# Patient Record
Sex: Female | Born: 1960
Health system: Southern US, Community
[De-identification: ages and names within clinical notes are randomized; demographics above are authoritative.]

## PROBLEM LIST (undated history)

## (undated) DIAGNOSIS — I10 Essential (primary) hypertension: Secondary | ICD-10-CM

## (undated) DIAGNOSIS — F32A Depression, unspecified: Secondary | ICD-10-CM

## (undated) HISTORY — DX: Depression, unspecified: F32.A

## (undated) HISTORY — PX: DG THUMB LEFT HAND: HXRAD1658

## (undated) HISTORY — PX: ABDOMINAL HYSTERECTOMY: SHX81

---

## 2005-12-25 ENCOUNTER — Emergency Department (HOSPITAL_COMMUNITY): Admission: EM | Admit: 2005-12-25 | Discharge: 2005-12-25 | Payer: Self-pay | Admitting: Emergency Medicine

## 2006-09-16 ENCOUNTER — Emergency Department (HOSPITAL_COMMUNITY): Admission: EM | Admit: 2006-09-16 | Discharge: 2006-09-16 | Payer: Self-pay | Admitting: Family Medicine

## 2007-03-21 ENCOUNTER — Emergency Department (HOSPITAL_COMMUNITY): Admission: EM | Admit: 2007-03-21 | Discharge: 2007-03-21 | Payer: Self-pay | Admitting: Emergency Medicine

## 2007-08-10 ENCOUNTER — Emergency Department (HOSPITAL_COMMUNITY): Admission: EM | Admit: 2007-08-10 | Discharge: 2007-08-10 | Payer: Self-pay | Admitting: Family Medicine

## 2007-12-18 ENCOUNTER — Emergency Department (HOSPITAL_COMMUNITY): Admission: EM | Admit: 2007-12-18 | Discharge: 2007-12-18 | Payer: Self-pay | Admitting: Emergency Medicine

## 2008-06-30 ENCOUNTER — Emergency Department (HOSPITAL_COMMUNITY): Admission: EM | Admit: 2008-06-30 | Discharge: 2008-06-30 | Payer: Self-pay | Admitting: Family Medicine

## 2010-01-06 ENCOUNTER — Emergency Department (HOSPITAL_COMMUNITY): Admission: EM | Admit: 2010-01-06 | Discharge: 2010-01-06 | Payer: Self-pay | Admitting: Emergency Medicine

## 2010-08-25 ENCOUNTER — Other Ambulatory Visit: Payer: Self-pay | Admitting: Internal Medicine

## 2010-08-25 DIAGNOSIS — Z1231 Encounter for screening mammogram for malignant neoplasm of breast: Secondary | ICD-10-CM

## 2010-09-03 ENCOUNTER — Ambulatory Visit: Payer: Self-pay

## 2010-09-21 LAB — WET PREP, GENITAL: Yeast Wet Prep HPF POC: NONE SEEN

## 2010-09-21 LAB — GC/CHLAMYDIA PROBE AMP, GENITAL: Chlamydia, DNA Probe: NEGATIVE

## 2011-03-18 LAB — POCT URINALYSIS DIP (DEVICE)
Glucose, UA: NEGATIVE
Ketones, ur: NEGATIVE
Operator id: 200941
Protein, ur: 100 — AB
Specific Gravity, Urine: >=1.03
pH: 6

## 2011-03-18 LAB — URINE CULTURE

## 2011-03-18 LAB — URINALYSIS, MICROSCOPIC ONLY
Glucose, UA: NEGATIVE
Ketones, ur: NEGATIVE
Nitrite: NEGATIVE
Specific Gravity, Urine: 1.03
Urobilinogen, UA: 1

## 2013-02-07 ENCOUNTER — Encounter (HOSPITAL_COMMUNITY): Payer: Self-pay | Admitting: *Deleted

## 2013-02-07 ENCOUNTER — Emergency Department (HOSPITAL_COMMUNITY)
Admission: EM | Admit: 2013-02-07 | Discharge: 2013-02-07 | Disposition: A | Discharge status: Home / Self Care | Payer: PRIVATE HEALTH INSURANCE | Attending: Emergency Medicine | Admitting: Emergency Medicine

## 2013-02-07 DIAGNOSIS — G44209 Tension-type headache, unspecified, not intractable: Secondary | ICD-10-CM

## 2013-02-07 DIAGNOSIS — I10 Essential (primary) hypertension: Secondary | ICD-10-CM | POA: Insufficient documentation

## 2013-02-07 HISTORY — DX: Essential (primary) hypertension: I10

## 2013-02-07 MED ORDER — BUPIVACAINE HCL (PF) 0.25 % IJ SOLN: 5.0000 mL | Freq: Once | INTRAMUSCULAR | Status: DC

## 2013-02-07 MED ORDER — BUPIVACAINE HCL (PF) 0.25 % IJ SOLN
5.0000 mL | INTRAMUSCULAR | Status: AC
  Administered 2013-02-07: 5 mL
  Filled 2013-02-07: qty 30

## 2013-02-07 MED ORDER — BUPIVACAINE HCL 0.25 % IJ SOLN
5.0000 mL | INTRAMUSCULAR | Status: DC
  Filled 2013-02-07 (×2): qty 5

## 2013-02-07 MED ORDER — DIAZEPAM 5 MG PO TABS: 5.0000 mg | ORAL_TABLET | Freq: Four times a day (QID) | ORAL | Status: DC | PRN

## 2013-02-07 NOTE — ED Notes (Signed)
Pt states for the last 3 days the top of her head is pounding.  Pt states it starts in her neck and radiates up to head.  Pt reports some dizziness and feels like she has to move slow.  Pt states she has never had a headache like this before.  Pt reports nausea and denies vomiting.

## 2013-02-07 NOTE — ED Provider Notes (Signed)
CSN: [id number]     Arrival date & time 02/07/13  1545 History   First MD Initiated Contact with Patient 02/07/13 [phone]     Chief Complaint  Patient presents with  . Headache   (Consider location/radiation/quality/duration/timing/severity/associated sxs/prior Treatment) Patient is a 52 y.o. female presenting with headaches. The history is provided by the patient. No language interpreter was used.  Headache Pain location:  Generalized Quality: throbbing. Radiates to:  R neck Severity currently:  8/10 Severity at highest:  8/10 Onset quality:  Gradual Duration:  4 days Associated symptoms: neck pain   Associated symptoms: no abdominal pain, no blurred vision, no congestion, no cough, no diarrhea, no pain, no fever, no focal weakness, no hearing loss, no nausea, no neck stiffness, no numbness, no paresthesias, no sore throat, no syncope, no URI, no vomiting and no weakness   Risk factors: no family hx of SAH     Past Medical History  Diagnosis Date  . Hypertension    Past Surgical History  Procedure Laterality Date  . Abdominal hysterectomy     No family history on file. History  Substance Use Topics  . Smoking status: Never Smoker   . Smokeless tobacco: Not on file  . Alcohol Use: Yes     Comment: occ   OB History   Grav Para Term Preterm Abortions TAB SAB Ect Mult Living                 Review of Systems  Constitutional: Negative for fever.  HENT: Positive for neck pain. Negative for hearing loss, congestion, sore throat, rhinorrhea and neck stiffness.   Eyes: Negative for blurred vision and pain.  Respiratory: Negative for cough and shortness of breath.   Cardiovascular: Negative for chest pain and syncope.  Gastrointestinal: Negative for nausea, vomiting, abdominal pain and diarrhea.  Genitourinary: Negative for dysuria and hematuria.  Skin: Negative for rash.  Neurological: Negative for focal weakness, syncope, light-headedness, numbness, headaches and  paresthesias.  All other systems reviewed and are negative.    Allergies  Review of patient's allergies indicates no known allergies.  Home Medications   Current Outpatient Rx  Name  Route  Sig  Dispense  Refill  . acetaminophen (TYLENOL) 500 MG tablet   Oral   Take 1,000 mg by mouth every 4 (four) hours as needed for pain (headache).          BP 135/92  Pulse 81  Temp(Src) 98.1 F (36.7 C) (Oral)  Resp 18  SpO2 100% Physical Exam  Nursing note and vitals reviewed. Constitutional: She is oriented to person, place, and time. She appears well-developed and well-nourished.  HENT:  Head: Normocephalic and atraumatic.  Right Ear: External ear normal.  Left Ear: External ear normal.  Eyes: EOM are normal. Pupils are equal, round, and reactive to light.  Neck: Normal range of motion. Neck supple.  Cardiovascular: Normal rate, regular rhythm and intact distal pulses.  Exam reveals no gallop and no friction rub.   No murmur heard. Pulmonary/Chest: Effort normal and breath sounds normal. No respiratory distress. She has no wheezes. She has no rales. She exhibits no tenderness.  Abdominal: Soft. Bowel sounds are normal. She exhibits no distension. There is no tenderness. There is no rebound.  Musculoskeletal: Normal range of motion. She exhibits no edema and no tenderness.  Lymphadenopathy:    She has no cervical adenopathy.  Neurological: She is alert and oriented to person, place, and time. She displays normal reflexes. No cranial nerve  deficit. She exhibits normal muscle tone. Coordination normal.  Skin: Skin is warm. No rash noted.  Psychiatric: She has a normal mood and affect. Her behavior is normal.    ED Course  Procedures (including critical care time) Labs Review Labs Reviewed - No data to display Imaging Review No results found.  MDM  No diagnosis found. 4:10 PM Pt is a 52 y.o. female with pertinent PMHX of HTN, off HCTZ for 3 years who presents to the ED with  headache. Headache described as throbbing in nature from her right lateral neck to the parietal region. Similarto previous headache pt had after giving birth to sone pt described " as related to my blood pressure".Has been off antihypertensive (HCTZ unknown dose for 3 years) Pt has associated nausea, denies vomiting, denies visual disturbance. No Aura prior to symptoms. No red flags noted on history such as: No Thunderclap symptoms, pt does not endorse unilateral vision loss or hearing loss. Pt denies recent illness.Denies photophobia/phonophobia. Denies seizure like activity.  On exam AFVSS. Neurologic exam: Fundoscopic exam: no papilledema, no hemorrhages, CN I-XII: grossly intact, Sensation: normal in upper and lower extremities, Strength 5/5 in both upper and lower extremities, Coordination intact. Gait normal. Trigger point noted to right lateral neck.  No Concerning signs for Thunderclap, will hold off CTA head neck to exclude aneurysm, SAH  Plan for trigger point injection of 2cc 0.25%marcaine, and script for valium. Pt tolerated procedure without complication.Liekly tension headache. Doubt migraine, no aura no photophobia or nausea or vomiting.   5:23 PM: I have discussed the diagnosis/risks/treatment options with the patient and believe the pt to be eligible for discharge home to [facility]. We also discussed returning to the ED immediately if new or worsening sx occur. We discussed the sx which are most concerning (e.g., thunderclap, stroke like symptoms, worsening symptoms) that necessitate immediate return. Any new prescriptions provided to the patient are listed below.   New Prescriptions   DIAZEPAM (VALIUM) 5 MG TABLET    Take 1 tablet (5 mg total) by mouth every 6 (six) hours as needed (spasms).    The patient appears reasonably screened and/or stabilized for discharge and I doubt any other medical condition or other [facility] requiring further screening,  evaluation or treatment in the ED at this time prior to discharge . Pt in agreement with discharge plan. Return precautions given. Pt discharged VSS  Pt was discussed with my attending, Dr. Erlene Quan, MD 02/07/13 [phone]

## 2013-02-10 NOTE — ED Provider Notes (Signed)
I saw and evaluated the patient, reviewed the resident's note and I agree with the findings and plan. Patient with headache. Maybe musculoskeletal. Will discharge  Leslie Steele. Rubin Payor, MD 02/10/13 [phone]

## 2013-02-14 ENCOUNTER — Ambulatory Visit: Payer: PRIVATE HEALTH INSURANCE | Attending: Cardiology | Admitting: Cardiology

## 2013-02-14 ENCOUNTER — Encounter: Payer: Self-pay | Admitting: Cardiology

## 2013-02-14 VITALS — BP 194/131 | HR 77 | Temp 98.7°F | Resp 18 | Ht 62.99 in | Wt 161.0 lb

## 2013-02-14 DIAGNOSIS — I10 Essential (primary) hypertension: Secondary | ICD-10-CM | POA: Insufficient documentation

## 2013-02-14 DIAGNOSIS — M542 Cervicalgia: Secondary | ICD-10-CM | POA: Insufficient documentation

## 2013-02-14 MED ORDER — AMLODIPINE BESYLATE 10 MG PO TABS: 10.0000 mg | ORAL_TABLET | Freq: Every day | ORAL | Status: DC

## 2013-02-14 MED ORDER — AMLODIPINE BESYLATE 10 MG PO TABS: 10.0000 mg | ORAL_TABLET | Freq: Once | ORAL | Status: DC

## 2013-02-14 MED ORDER — AMLODIPINE BESYLATE 2.5 MG PO TABS
10.0000 mg | ORAL_TABLET | Freq: Once | ORAL | Status: AC
  Administered 2013-02-14: 10 mg via ORAL

## 2013-02-14 NOTE — Progress Notes (Signed)
Pt is here to est care Recently seen at [facility] ER C/o neck pain that radiates to head BP today is 194/131... Denies: SOB, CP, edema, blurry vision Reports no hx of HTN Dr. Daleen Squibb has been notified. Pt is alert w/no signs of acute distress.

## 2013-02-14 NOTE — Progress Notes (Signed)
HPI Ms Leslie Steele comes in today after being seen in emergency room for neck pain and hypertension. He complains of constant aching in the right base of her skull. It is not made worse by turning. At night it goes up into her head and usually  wakes her up at 5 AM.  She has a history of hypertension but quit taking her hydrochlorothiazide because she did not have any prescription or money. She does not smoke. She does not use any decongestants or stimulants. She does on occasion drink alcohol.  Past Medical History  Diagnosis Date  . Hypertension     Current Outpatient Prescriptions  Medication Sig Dispense Refill  . acetaminophen (TYLENOL) 500 MG tablet Take 1,000 mg by mouth every 4 (four) hours as needed for pain (headache).      . diazepam (VALIUM) 5 MG tablet Take 1 tablet (5 mg total) by mouth every 6 (six) hours as needed (spasms).  3 tablet  0   No current facility-administered medications for this visit.    No Known Allergies  No family history on file.  History   Social History  . Marital Status: Single    Spouse Name: N/A    Number of Children: N/A  . Years of Education: N/A   Occupational History  . Not on file.   Social History Main Topics  . Smoking status: Never Smoker   . Smokeless tobacco: Not on file  . Alcohol Use: Yes     Comment: occ  . Drug Use: No  . Sexual Activity: Not on file   Other Topics Concern  . Not on file   Social History Narrative  . No narrative on file    ROS ALL NEGATIVE EXCEPT THOSE NOTED IN HPI  PE  General Appearance: well developed, well nourished in no acute distress HEENT: symmetrical face, PERRLA, good dentition  Neck: no JVD, thyromegaly, or adenopathy, trachea midline Chest: symmetric without deformity Cardiac: PMI non-displaced, RRR, normal S1, S2, no gallop or murmur Lung: clear to ausculation and percussion Vascular: all pulses full without bruits  Abdominal: nondistended, nontender, good bowel sounds, no HSM, no  bruits Extremities: no cyanosis, clubbing or edema, no sign of DVT, no varicosities  Skin: normal color, no rashes Neuro: alert and oriented x 3, non-focal Pysch: normal affect  EKG Normal sinus rhythm, nonspecific ST segment changes, no evidence of LVH BMET No results found for this basename: na, k, cl, co2, glucose, bun, creatinine, calcium, gfrnonaa, gfraa    Lipid Panel  No results found for this basename: chol, trig, hdl, cholhdl, vldl, ldlcalc    CBC No results found for this basename: wbc, rbc, hgb, hct, plt, mcv, mch, mchc, rdw, neutrabs, lymphsabs, monoabs, eosabs, basosabs

## 2013-02-14 NOTE — Assessment & Plan Note (Signed)
I prescribed amlodipine 10 mg a day. We'll check baseline EKG. Followup in 2 days here for blood pressure check. Probably will have to had a second agent. I will see her back in the office next week on Wednesday. Her headache is due to her hypertension as I explained to her today. No salt.

## 2013-02-16 ENCOUNTER — Ambulatory Visit: Payer: No Typology Code available for payment source | Attending: Internal Medicine | Admitting: Internal Medicine

## 2013-02-16 VITALS — BP 156/121 | HR 89 | Temp 98.9°F | Resp 16

## 2013-02-16 DIAGNOSIS — I10 Essential (primary) hypertension: Secondary | ICD-10-CM

## 2013-02-16 MED ORDER — LISINOPRIL 10 MG PO TABS: 10.0000 mg | ORAL_TABLET | Freq: Every day | ORAL | Status: DC

## 2013-02-16 NOTE — Progress Notes (Signed)
Patient here for blood pressure check only States still does not feel good Headache and left eye is red

## 2013-02-16 NOTE — Progress Notes (Signed)
Patient ID: Leslie Steele, female   DOB: [birth year], 52 y.o.   MRN: [id number]   CC: Blood pressure check  HPI: Patient is 52 year old female with history of hypertension, recently started on Norvasc 10 mg tablet. She comes back today for blood pressure check. She explains she has been checking her blood pressure at home and the numbers are usually higher than 150/100. Last night her blood pressure was 178/120. She reports compliance with medicine and would like to know if she needs additional medication. She denies chest pain or shortness of breath, no specific abdominal or urinary concerns. She does mention blurry vision and fullness in the ears. She explains this is relatively chronic for her and wants to know if this could be related to her high blood pressure as well.  No Known Allergies Past Medical History  Diagnosis Date  . Hypertension    Current Outpatient Prescriptions on File Prior to Visit  Medication Sig Dispense Refill  . acetaminophen (TYLENOL) 500 MG tablet Take 1,000 mg by mouth every 4 (four) hours as needed for pain (headache).      Marland Kitchen amLODipine (NORVASC) 10 MG tablet Take 1 tablet (10 mg total) by mouth daily.  90 tablet  1  . diazepam (VALIUM) 5 MG tablet Take 1 tablet (5 mg total) by mouth every 6 (six) hours as needed (spasms).  3 tablet  0   No current facility-administered medications on file prior to visit.   Family history of hypertension History   Social History  . Marital Status: Single    Spouse Name: N/A    Number of Children: N/A  . Years of Education: N/A   Occupational History  . Not on file.   Social History Main Topics  . Smoking status: Never Smoker   . Smokeless tobacco: Not on file  . Alcohol Use: Yes     Comment: occ  . Drug Use: No  . Sexual Activity: Not on file   Other Topics Concern  . Not on file   Social History Narrative  . No narrative on file    Review of Systems  Constitutional: Negative for fever, chills, diaphoresis,  activity change, appetite change and fatigue.  HENT: Negative for ear pain, nosebleeds, congestion, facial swelling, rhinorrhea, neck pain, neck stiffness and ear discharge.   Eyes: Negative for pain, discharge, redness, itching and visual disturbance.  Respiratory: Negative for cough, choking, chest tightness, shortness of breath, wheezing and stridor.   Cardiovascular: Negative for chest pain, palpitations and leg swelling.  Gastrointestinal: Negative for abdominal distention.  Genitourinary: Negative for dysuria, urgency, frequency, hematuria, flank pain, decreased urine volume, difficulty urinating and dyspareunia.  Musculoskeletal: Negative for back pain, joint swelling, arthralgias and gait problem.  Neurological: Negative for tremors, seizures, syncope, facial asymmetry, speech difficulty, weakness, light-headedness, numbness and headaches.  Hematological: Negative for adenopathy. Does not bruise/bleed easily.  Psychiatric/Behavioral: Negative for hallucinations, behavioral problems, confusion, dysphoric mood, decreased concentration and agitation.    Objective:   Filed Vitals:   02/16/13 0908  BP: 156/121  Pulse: 89  Temp: 98.9 F (37.2 C)  Resp: 16    Physical Exam  Constitutional: Appears well-developed and well-nourished. No distress.  Neck: Normal ROM. Neck supple. No JVD. No tracheal deviation. No thyromegaly.  CVS: RRR, S1/S2 +, no murmurs, no gallops, no carotid bruit.  Pulmonary: Effort and breath sounds normal, no stridor, rhonchi, wheezes, rales.  Abdominal: Soft. BS +,  no distension, tenderness, rebound or guarding.  Musculoskeletal: Normal range of  motion. No edema and no tenderness.  Lymphadenopathy: No lymphadenopathy noted, cervical, inguinal. Neuro: Alert. Normal reflexes, muscle tone coordination. No cranial nerve deficit.  No results found for this basename: WBC, HGB, HCT, MCV, PLT   No results found for this basename: CREATININE, BUN, NA, K, CL, CO2     No results found for this basename: HGBA1C   Lipid Panel  No results found for this basename: chol, trig, hdl, cholhdl, vldl, ldlcalc       Assessment and plan:   Patient Active Problem List   Diagnosis Date Noted  . Essential hypertension 02/14/2013   - I have advised patient to continue checking blood pressure regularly - Lisinopril 10 mg tablet daily and I have advised patient to come back in 1-2 weeks to have blood work checked to insure that potassium and creatinine are stable and within normal limits - She is unable to have blood test done today since she has to pick up her daughter from school but has agreed to have it done on the next visit - We have discussed importance of normal renal function and lisinopril was started and also possible side effect of higher potassium level - We have discussed dietary restrictions, importance of exercise - Continue Norvasc as well as previously prescribed

## 2013-02-16 NOTE — Patient Instructions (Signed)

## 2013-02-21 ENCOUNTER — Encounter: Payer: Self-pay | Admitting: Cardiology

## 2013-02-21 ENCOUNTER — Ambulatory Visit: Payer: No Typology Code available for payment source | Attending: Cardiology | Admitting: Cardiology

## 2013-02-21 VITALS — BP 140/96 | HR 78 | Temp 98.1°F | Resp 16

## 2013-02-21 DIAGNOSIS — I10 Essential (primary) hypertension: Secondary | ICD-10-CM

## 2013-02-21 NOTE — Patient Instructions (Signed)
Take amlodipine as prescribed. Low-sodium diet. Return next week for blood pressure check and brief visit with Dr Daleen Squibb.

## 2013-02-21 NOTE — Progress Notes (Signed)
PT HERE FOR REPEAT BP CHECK WITH H/A SECONDARY TO HTN TAKING PRESCRIBED MEDS PT TO TAKE BOTH BP MEDS IN AM AND RETURN X 1 WEEK

## 2013-02-21 NOTE — Assessment & Plan Note (Signed)
Blood pressure is improved. This is with only taking lisinopril this morning. I've asked her to start amlodipine as well now. We'll schedule her for blood pressure check on the 29th when she comes in for blood work. I think her headaches will get better. All questions answered.

## 2013-02-21 NOTE — Progress Notes (Signed)
Patient returns today after being seen last week for hypertension and headaches. She only took the lisinopril this morning. Her blood pressure is much better than last week. Headaches have improved some. She is still discouraged.  Physical examination is unchanged today. She is for followup blood work on the 29th on the lisinopril.

## 2013-03-02 ENCOUNTER — Ambulatory Visit: Payer: No Typology Code available for payment source | Attending: Internal Medicine

## 2013-03-02 VITALS — BP 122/85 | HR 68

## 2013-03-02 DIAGNOSIS — I10 Essential (primary) hypertension: Secondary | ICD-10-CM

## 2013-03-02 LAB — CBC WITH DIFFERENTIAL/PLATELET
Basophils Absolute: 0 10*3/uL (ref 0.0–0.1)
Basophils Relative: 1 % (ref 0–1)
Hemoglobin: 13 g/dL (ref 12.0–15.0)
Lymphocytes Relative: 43 % (ref 12–46)
MCHC: 33 g/dL (ref 30.0–36.0)
Neutro Abs: 1.7 10*3/uL (ref 1.7–7.7)
Platelets: 255 10*3/uL (ref 150–400)
WBC: 4.5 10*3/uL (ref 4.0–10.5)

## 2013-03-02 LAB — LIPID PANEL
Total CHOL/HDL Ratio: 4.2 Ratio
VLDL: 20 mg/dL (ref 0–40)

## 2013-03-02 LAB — BASIC METABOLIC PANEL
CO2: 29 mEq/L (ref 19–32)
Calcium: 10.4 mg/dL (ref 8.4–10.5)
Chloride: 104 mEq/L (ref 96–112)
Glucose, Bld: 105 mg/dL — ABNORMAL HIGH (ref 70–99)
Sodium: 140 mEq/L (ref 135–145)

## 2013-03-02 NOTE — Progress Notes (Unsigned)
Patient is here today for a BP recheck/labs did BMET, CBC, & Lipid

## 2013-03-09 ENCOUNTER — Ambulatory Visit: Payer: No Typology Code available for payment source

## 2013-03-16 ENCOUNTER — Ambulatory Visit: Payer: No Typology Code available for payment source | Attending: Internal Medicine

## 2013-03-16 NOTE — Progress Notes (Unsigned)
Patient here for blood pressure check only 137/88 pulse 78

## 2013-04-20 ENCOUNTER — Emergency Department (HOSPITAL_COMMUNITY)
Admission: EM | Admit: 2013-04-20 | Discharge: 2013-04-20 | Disposition: A | Discharge status: Home / Self Care | Payer: No Typology Code available for payment source | Attending: Emergency Medicine | Admitting: Emergency Medicine

## 2013-04-20 ENCOUNTER — Encounter (HOSPITAL_COMMUNITY): Payer: Self-pay | Admitting: Emergency Medicine

## 2013-04-20 DIAGNOSIS — H538 Other visual disturbances: Secondary | ICD-10-CM | POA: Insufficient documentation

## 2013-04-20 DIAGNOSIS — I10 Essential (primary) hypertension: Secondary | ICD-10-CM | POA: Insufficient documentation

## 2013-04-20 DIAGNOSIS — Z79899 Other long term (current) drug therapy: Secondary | ICD-10-CM | POA: Insufficient documentation

## 2013-04-20 DIAGNOSIS — D3192 Benign neoplasm of unspecified part of left eye: Secondary | ICD-10-CM

## 2013-04-20 DIAGNOSIS — R220 Localized swelling, mass and lump, head: Secondary | ICD-10-CM | POA: Insufficient documentation

## 2013-04-20 NOTE — ED Provider Notes (Signed)
CSN: [id number]     Arrival date & time 04/20/13  1359 History  This chart was scribed for Marlon Pel, PA-C, working with Audree Camel, MD, by [facility] ED Scribe. This patient was seen in room WTR6/WTR6 and the patient's care was started at 2:46 PM.     Chief Complaint  Patient presents with  . Eye Problem    The history is provided by the patient. No language interpreter was used.    HPI Comments: Leslie Steele is a 52 y.o. female with a history of HTN who presents to the Emergency Department complaining of a growth to her left lower eyelid that has been present and gradually worsening for the past month. She reports associated itching to the area, but denies associated pain. She states that has grown to the extent that it obstructs her peripheral vision at times.   Past Medical History  Diagnosis Date  . Hypertension    Past Surgical History  Procedure Laterality Date  . Abdominal hysterectomy     No family history on file. History  Substance Use Topics  . Smoking status: Never Smoker   . Smokeless tobacco: Not on file  . Alcohol Use: Yes     Comment: occ   OB History   Grav Para Term Preterm Abortions TAB SAB Ect Mult Living                 Review of Systems  Eyes: Positive for itching and visual disturbance. Negative for pain.       Growth to left lower eyelid  All other systems reviewed and are negative.    Allergies  Review of patient's allergies indicates no known allergies.  Home Medications   Current Outpatient Rx  Name  Route  Sig  Dispense  Refill  . acetaminophen (TYLENOL) 500 MG tablet   Oral   Take 1,000 mg by mouth every 4 (four) hours as needed for pain (headache).         Marland Kitchen amLODipine (NORVASC) 10 MG tablet   Oral   Take 1 tablet (10 mg total) by mouth daily.   90 tablet   1   . Chlorpheniramine-DM (CORICIDIN COUGH/COLD) 4-30 MG TABS   Oral   Take 2 tablets by mouth every 4 (four) hours as needed (for cold).         Marland Kitchen  lisinopril (PRINIVIL,ZESTRIL) 10 MG tablet   Oral   Take 1 tablet (10 mg total) by mouth daily.   90 tablet   3    Triage Vitals: BP 143/94  Pulse 74  Temp(Src) 97.9 F (36.6 C) (Oral)  Resp 14  Ht 5\' 2"  (1.575 m)  Wt 160 lb (72.576 kg)  BMI 29.26 kg/m2  SpO2 100%  Physical Exam  Nursing note and vitals reviewed. Constitutional: She is oriented to person, place, and time. She appears well-developed and well-nourished. No distress.  HENT:  Head: Normocephalic and atraumatic.  Eyes: EOM are normal.  .75 cm in diameter growth to left lower lid margin That appears highly  Vascular With a black cone to the tip No eyeball involvement.  No additional information visualized on slit lamp  Neck: Neck supple. No tracheal deviation present.  Cardiovascular: Normal rate.   Pulmonary/Chest: Effort normal. No respiratory distress.  Musculoskeletal: Normal range of motion.  Neurological: She is alert and oriented to person, place, and time.  Skin: Skin is warm and dry.  Psychiatric: She has a normal mood and affect. Her behavior  is normal.    ED Course  Procedures (including critical care time)  DIAGNOSTIC STUDIES: Oxygen Saturation is 100% on RA, normal by my interpretation.    COORDINATION OF CARE: 2:49 PM- Advised pt of plan to consult the attending provider, Dr. Criss Alvine regarding her case. Discussed with pt that the area appears to have vasculature and that bleeding to the area if removed may be difficult to control in an ED setting. Pt advised of plan for treatment and pt agrees. Discussed case with Dr. Spero Curb- He will see patient in the office. He asks her to call today or Monday to be seen this week.  Labs Review Labs Reviewed - No data to display Imaging Review No results found.  EKG Interpretation   None       MDM   1. Benign growth on outer layer of eye, left    52 y.o.Leslie Steele's evaluation in the Emergency Department is complete. It has been  determined that no acute conditions requiring further emergency intervention are present at this time. The patient/guardian have been advised of the diagnosis and plan. We have discussed signs and symptoms that warrant return to the ED, such as changes or worsening in symptoms.  Vital signs are stable at discharge. Filed Vitals:   04/20/13 1407  BP: 143/94  Pulse:   Temp:   Resp:     Patient/guardian has voiced understanding and agreed to follow-up with the PCP or specialist.  I personally performed the services described in this documentation, which was scribed in my presence. The recorded information has been reviewed and is accurate.   Dorthula Matas, PA-C 04/20/13 [phone]

## 2013-04-20 NOTE — ED Notes (Signed)
Pt c/o swelling eye lesion to left eye. Pt denies pain but states over the past monthit has gotten bigger and now affecting her peripheral vision.

## 2013-04-20 NOTE — ED Provider Notes (Signed)
Medical screening examination/treatment/procedure(s) were conducted as a shared visit with non-physician practitioner(s) and myself.  I personally evaluated the patient during the encounter.  EKG Interpretation   None       Patient with unk growth on lower eye lid. No ocular sx. Possible hemangioma. No sign of abscess. Will consult ophtho for outpatient eval and removal.  Audree Camel, MD 04/20/13 1715

## 2013-04-20 NOTE — Progress Notes (Signed)
[facility] CL spoke with patient about [employer]. Patient confirmed her pcp was [facility]. Patient declined my offer to set her up with a follow-up apt.

## 2013-05-01 ENCOUNTER — Ambulatory Visit: Payer: No Typology Code available for payment source | Attending: Internal Medicine

## 2013-05-01 DIAGNOSIS — Z23 Encounter for immunization: Secondary | ICD-10-CM

## 2013-06-11 ENCOUNTER — Encounter: Payer: Self-pay | Admitting: Internal Medicine

## 2013-06-11 ENCOUNTER — Ambulatory Visit: Payer: No Typology Code available for payment source | Attending: Internal Medicine | Admitting: Internal Medicine

## 2013-06-11 VITALS — BP 128/91 | HR 89 | Temp 98.3°F | Resp 16 | Ht 61.5 in | Wt 159.0 lb

## 2013-06-11 DIAGNOSIS — N390 Urinary tract infection, site not specified: Secondary | ICD-10-CM

## 2013-06-11 DIAGNOSIS — E785 Hyperlipidemia, unspecified: Secondary | ICD-10-CM

## 2013-06-11 DIAGNOSIS — I10 Essential (primary) hypertension: Secondary | ICD-10-CM

## 2013-06-11 DIAGNOSIS — Z0000 Encounter for general adult medical examination without abnormal findings: Secondary | ICD-10-CM | POA: Insufficient documentation

## 2013-06-11 LAB — POCT URINALYSIS DIPSTICK
Bilirubin, UA: NEGATIVE
GLUCOSE UA: NEGATIVE
Ketones, UA: NEGATIVE
Nitrite, UA: NEGATIVE
PH UA: 7
SPEC GRAV UA: 1.015
UROBILINOGEN UA: 0.2

## 2013-06-11 MED ORDER — CIPROFLOXACIN HCL 500 MG PO TABS
500.0000 mg | ORAL_TABLET | Freq: Two times a day (BID) | ORAL | Status: DC
Start: 2013-06-11 — End: 2019-11-09

## 2013-06-11 MED ORDER — AMLODIPINE BESYLATE 10 MG PO TABS: 10.0000 mg | ORAL_TABLET | Freq: Every day | ORAL | Status: DC

## 2013-06-11 MED ORDER — FERROUS SULFATE 325 (65 FE) MG PO TABS: 325.0000 mg | ORAL_TABLET | Freq: Two times a day (BID) | ORAL | Status: DC

## 2013-06-11 NOTE — Progress Notes (Signed)
Patient ID: [name], female   DOB: 1961-04-15, 53 y.o.   MRN: [id number] Patient Demographics  Leslie Steele, is a 53 y.o. female  [id number]  [id number]  DOB - 09/23/1960  Chief Complaint  Patient presents with  . Follow-up        Subjective:   Leslie Steele is a 53 y.o. female here today for a follow up visit. Patient is known to have hypertension, now better controlled. Her major complaint today is burning sensation during micturition associated with frequency and suprapubic pain. She has no vaginal discharge. No fever, no loin pain. Patient also want to know the results of previous laboratory tests. She has not had a Pap smear done before nor mammogram, she has not had colonoscopy done. She does not smoke cigarette, she does drink alcohol occasionally. Patient has No headache, No chest pain, No abdominal pain - No Nausea, No new weakness tingling or numbness, No Cough - SOB.  ALLERGIES: No Known Allergies  PAST MEDICAL HISTORY: Past Medical History  Diagnosis Date  . Hypertension     MEDICATIONS AT HOME: Prior to Admission medications   Medication Sig Start Date End Date Taking? Authorizing Provider  amLODipine (NORVASC) 10 MG tablet Take 1 tablet (10 mg total) by mouth daily. 06/11/13  Yes Angelica Chessman, MD  lisinopril (PRINIVIL,ZESTRIL) 10 MG tablet Take 1 tablet (10 mg total) by mouth daily. 02/16/13  Yes Theodis Blaze, MD  acetaminophen (TYLENOL) 500 MG tablet Take 1,000 mg by mouth every 4 (four) hours as needed for pain (headache).    Historical Provider, MD  Chlorpheniramine-DM (CORICIDIN COUGH/COLD) 4-30 MG TABS Take 2 tablets by mouth every 4 (four) hours as needed (for cold).    Historical Provider, MD  ciprofloxacin (CIPRO) 500 MG tablet Take 1 tablet (500 mg total) by mouth 2 (two) times daily. 06/11/13   Angelica Chessman, MD  ferrous sulfate 325 (65 FE) MG tablet Take 1 tablet (325 mg total) by mouth 2 (two) times daily with a meal. 06/11/13   Angelica Chessman, MD     Objective:   Filed Vitals:   06/11/13 1443  BP: 128/91  Pulse: 89  Temp: 98.3 F (36.8 C)  TempSrc: Oral  Resp: 16  Height: 5' 1.5" (1.562 m)  Weight: 159 lb (72.122 kg)  SpO2: 98%    Exam General appearance : Awake, alert, not in any distress. Speech Clear. Not toxic looking HEENT: Atraumatic and Normocephalic, pupils equally reactive to light and accomodation Neck: supple, no JVD. No cervical lymphadenopathy.  Chest:Good air entry bilaterally, no added sounds  CVS: S1 S2 regular, no murmurs.  Abdomen: Bowel sounds present, Non tender and not distended with no gaurding, rigidity or rebound. Extremities: B/L Lower Ext shows no edema, both legs are warm to touch Neurology: Awake alert, and oriented X 3, CN II-XII intact, Non focal Skin:No Rash Wounds:N/A   Data Review   CBC No results found for this basename: WBC, HGB, HCT, PLT, MCV, MCH, MCHC, RDW, NEUTRABS, LYMPHSABS, MONOABS, EOSABS, BASOSABS, BANDABS, BANDSABD,  in the last 168 hours  Chemistries   No results found for this basename: NA, K, CL, CO2, GLUCOSE, BUN, CREATININE, GFRCGP, CALCIUM, MG, AST, ALT, ALKPHOS, BILITOT,  in the last 168 hours ------------------------------------------------------------------------------------------------------------------ No results found for this basename: HGBA1C,  in the last 72 hours ------------------------------------------------------------------------------------------------------------------ No results found for this basename: CHOL, HDL, LDLCALC, TRIG, CHOLHDL, LDLDIRECT,  in the last 72 hours ------------------------------------------------------------------------------------------------------------------ No results found for this basename: TSH, T4TOTAL, FREET3,  T3FREE, THYROIDAB,  in the last 72 hours ------------------------------------------------------------------------------------------------------------------ No results found for this basename:  VITAMINB12, FOLATE, FERRITIN, TIBC, IRON, RETICCTPCT,  in the last 72 hours  Coagulation profile  No results found for this basename: INR, PROTIME,  in the last 168 hours    Assessment & Plan   1. Preventative health care Previous labs reviewed, showed evidence of iron deficiency. Patient is also due for mammogram. - Urinalysis Dipstick - MM Digital Screening; Future - ferrous sulfate 325 (65 FE) MG tablet; Take 1 tablet (325 mg total) by mouth 2 (two) times daily with a meal.  Dispense: 120 tablet; Refill: 3  2. Essential hypertension Refill - amLODipine (NORVASC) 10 MG tablet; Take 1 tablet (10 mg total) by mouth daily.  Dispense: 90 tablet; Refill: 3 Continue lisinopril 10 mg tablet by mouth daily  3. UTI (urinary tract infection) Empiric treatment - ciprofloxacin (CIPRO) 500 MG tablet; Take 1 tablet (500 mg total) by mouth 2 (two) times daily.  Dispense: 20 tablet; Refill: 0 - CULTURE, URINE COMPREHENSIVE  4. Dyslipidemia Patient counseled extensively about nutrition and exercise We'll repeat lipid profile next visit  Follow up in 4 weeks for Pap smear and blood draw   The patient was given clear instructions to go to ER or return to medical center if symptoms don't improve, worsen or new problems develop. The patient verbalized understanding. The patient was told to call to get lab results if they haven't heard anything in the next week.    Angelica Chessman, MD, [place], [place], [place] and [place], [place]   06/11/2013, 3:41 PM

## 2013-06-11 NOTE — Patient Instructions (Signed)
DASH Diet The DASH diet stands for "Dietary Approaches to Stop Hypertension." It is a healthy eating plan that has been shown to reduce high blood pressure (hypertension) in as little as 14 days, while also possibly providing other significant health benefits. These other health benefits include reducing the risk of breast cancer after menopause and reducing the risk of type 2 diabetes, heart disease, colon cancer, and stroke. Health benefits also include weight loss and slowing kidney failure in patients with chronic kidney disease.  DIET GUIDELINES  Limit salt (sodium). Your diet should contain less than 1500 mg of sodium daily.  Limit refined or processed carbohydrates. Your diet should include mostly whole grains. Desserts and added sugars should be used sparingly.  Include small amounts of heart-healthy fats. These types of fats include nuts, oils, and tub margarine. Limit saturated and trans fats. These fats have been shown to be harmful in the body. CHOOSING FOODS  The following food groups are based on a 2000 calorie diet. See your Registered Dietitian for individual calorie needs. Grains and Grain Products (6 to 8 servings daily)  Eat More Often: Whole-wheat bread, brown rice, whole-grain or wheat pasta, quinoa, popcorn without added fat or salt (air popped).  Eat Less Often: White bread, white pasta, white rice, cornbread. Vegetables (4 to 5 servings daily)  Eat More Often: Fresh, frozen, and canned vegetables. Vegetables may be raw, steamed, roasted, or grilled with a minimal amount of fat.  Eat Less Often/Avoid: Creamed or fried vegetables. Vegetables in a cheese sauce. Fruit (4 to 5 servings daily)  Eat More Often: All fresh, canned (in natural juice), or frozen fruits. Dried fruits without added sugar. One hundred percent fruit juice ( cup [237 mL] daily).  Eat Less Often: Dried fruits with added sugar. Canned fruit in light or heavy syrup. Lean Meats, Fish, and Poultry (2  servings or less daily. One serving is 3 to 4 oz [85-114 g]).  Eat More Often: Ninety percent or leaner ground beef, tenderloin, sirloin. Round cuts of beef, chicken breast, turkey breast. All fish. Grill, bake, or broil your meat. Nothing should be fried.  Eat Less Often/Avoid: Fatty cuts of meat, turkey, or chicken leg, thigh, or wing. Fried cuts of meat or fish. Dairy (2 to 3 servings)  Eat More Often: Low-fat or fat-free milk, low-fat plain or light yogurt, reduced-fat or part-skim cheese.  Eat Less Often/Avoid: Milk (whole, 2%).Whole milk yogurt. Full-fat cheeses. Nuts, Seeds, and Legumes (4 to 5 servings per week)  Eat More Often: All without added salt.  Eat Less Often/Avoid: Salted nuts and seeds, canned beans with added salt. Fats and Sweets (limited)  Eat More Often: Vegetable oils, tub margarines without trans fats, sugar-free gelatin. Mayonnaise and salad dressings.  Eat Less Often/Avoid: Coconut oils, palm oils, butter, stick margarine, cream, half and half, cookies, candy, pie. FOR MORE INFORMATION The Dash Diet Eating Plan: www.dashdiet.org Document Released: 05/13/2011 Document Revised: 08/16/2011 Document Reviewed: 05/13/2011 [employer] Patient Information 2014 [employer]. Hypertension As your heart beats, it forces blood through your arteries. This force is your blood pressure. If the pressure is too high, it is called hypertension (HTN) or high blood pressure. HTN is dangerous because you may have it and not know it. High blood pressure may mean that your heart has to work harder to pump blood. Your arteries may be narrow or stiff. The extra work puts you at risk for heart disease, stroke, and other problems.  Blood pressure consists of two numbers, a   higher number over a lower, 110/72, for example. It is stated as "110 over 72." The ideal is below 120 for the top number (systolic) and under 80 for the bottom (diastolic). Write down your blood pressure today. You  should pay close attention to your blood pressure if you have certain conditions such as:  Heart failure.  Prior heart attack.  Diabetes  Chronic kidney disease.  Prior stroke.  Multiple risk factors for heart disease. To see if you have HTN, your blood pressure should be measured while you are seated with your arm held at the level of the heart. It should be measured at least twice. A one-time elevated blood pressure reading (especially in the Emergency Department) does not mean that you need treatment. There may be conditions in which the blood pressure is different between your right and left arms. It is important to see your caregiver soon for a recheck. Most people have essential hypertension which means that there is not a specific cause. This type of high blood pressure may be lowered by changing lifestyle factors such as:  Stress.  Smoking.  Lack of exercise.  Excessive weight.  Drug/tobacco/alcohol use.  Eating less salt. Most people do not have symptoms from high blood pressure until it has caused damage to the body. Effective treatment can often prevent, delay or reduce that damage. TREATMENT  When a cause has been identified, treatment for high blood pressure is directed at the cause. There are a large number of medications to treat HTN. These fall into several categories, and your caregiver will help you select the medicines that are best for you. Medications may have side effects. You should review side effects with your caregiver. If your blood pressure stays high after you have made lifestyle changes or started on medicines,   Your medication(s) may need to be changed.  Other problems may need to be addressed.  Be certain you understand your prescriptions, and know how and when to take your medicine.  Be sure to follow up with your caregiver within the time frame advised (usually within two weeks) to have your blood pressure rechecked and to review your  medications.  If you are taking more than one medicine to lower your blood pressure, make sure you know how and at what times they should be taken. Taking two medicines at the same time can result in blood pressure that is too low. SEEK IMMEDIATE MEDICAL CARE IF:  You develop a severe headache, blurred or changing vision, or confusion.  You have unusual weakness or numbness, or a faint feeling.  You have severe chest or abdominal pain, vomiting, or breathing problems. MAKE SURE YOU:   Understand these instructions.  Will watch your condition.  Will get help right away if you are not doing well or get worse. Document Released: 05/24/2005 Document Revised: 08/16/2011 Document Reviewed: 01/12/2008 [facility] Patient Information 2014 [place]. Iron Deficiency Anemia  Anemia is when you have a low number of healthy red blood cells. HOME CARE   Ask your doctor or dietician what foods you should eat.  Take iron and vitamins as told by your doctor.  Eat foods that have iron in them. This includes liver, lean beef, whole-grain bread, eggs, dried fruit, and dark green leafy vegetables. GET HELP RIGHT AWAY IF:  You pass out (faint).  You have chest pain.  You feel sick to your stomach (nauseous) or throw up (vomit).  You get very short of breath with activity.  You are  weak.  You are thirstier than normal.  You have a fast heartbeat.  You start to sweat or become lightheaded when getting up from a chair or bed. MAKE SURE YOU:  Understand these instructions.  Will watch your condition.  Will get help right away if you are not doing well or get worse. Document Released: 06/26/2010 Document Revised: 08/16/2011 Document Reviewed: 06/26/2010 [facility] Patient Information 2014 [place], [place].

## 2013-06-11 NOTE — Progress Notes (Signed)
Pt thinks that she might have a UTI. Pt is needing a medication refill.

## 2013-06-13 LAB — CULTURE, URINE COMPREHENSIVE: Colony Count: >=100000

## 2013-06-14 ENCOUNTER — Telehealth: Payer: Self-pay | Admitting: *Deleted

## 2013-06-14 NOTE — Telephone Encounter (Signed)
Left message 1st attempt.  

## 2013-06-14 NOTE — Telephone Encounter (Signed)
Message copied by Joan Mayans on Thu Jun 14, 2013  9:17 AM ------      Message from: Angelica Chessman E      Created: Wed Jun 13, 2013  1:50 PM       Please inform patient that her urine culture results shows that she has an infection, but the good news is the antibiotics we gave her is bruit over the infection. She was encouraged to complete his antibiotics, drink plenty of water. Let us know if symptoms resolved ------

## 2013-06-15 ENCOUNTER — Telehealth: Payer: Self-pay | Admitting: *Deleted

## 2013-06-15 NOTE — Telephone Encounter (Signed)
Message copied by Joan Mayans on Fri Jun 15, 2013  2:09 PM ------      Message from: Angelica Chessman E      Created: Wed Jun 13, 2013  1:50 PM       Please inform patient that her urine culture results shows that she has an infection, but the good news is the antibiotics we gave her is bruit over the infection. She was encouraged to complete his antibiotics, drink plenty of water. Let us know if symptoms resolved ------

## 2013-06-15 NOTE — Telephone Encounter (Signed)
I spoke to pt and informed her that she did have an infection but the antibiotic that she was already prescribed should treat the infection. If she still had symptoms after the antibiotics were completed to give me a call back so we could further evaluate the condition.

## 2013-07-10 ENCOUNTER — Ambulatory Visit: Payer: Self-pay | Admitting: Internal Medicine

## 2013-07-26 ENCOUNTER — Ambulatory Visit: Payer: Self-pay | Admitting: Internal Medicine

## 2013-08-02 ENCOUNTER — Ambulatory Visit: Payer: Self-pay | Admitting: Internal Medicine

## 2013-11-29 ENCOUNTER — Telehealth: Payer: Self-pay | Admitting: Emergency Medicine

## 2013-11-29 ENCOUNTER — Telehealth: Payer: Self-pay | Admitting: Internal Medicine

## 2013-11-29 ENCOUNTER — Other Ambulatory Visit: Payer: Self-pay | Admitting: Emergency Medicine

## 2013-11-29 DIAGNOSIS — I10 Essential (primary) hypertension: Secondary | ICD-10-CM

## 2013-11-29 MED ORDER — AMLODIPINE BESYLATE 10 MG PO TABS: 10.0000 mg | ORAL_TABLET | Freq: Every day | ORAL | Status: DC

## 2013-11-29 MED ORDER — LISINOPRIL 10 MG PO TABS: 10.0000 mg | ORAL_TABLET | Freq: Every day | ORAL | Status: DC

## 2013-11-29 NOTE — Telephone Encounter (Signed)
Pt has called in today to request a refill on medications amLODipine (NORVASC) 10 MG tablet & lisinopril (PRINIVIL,ZESTRIL) 10 MG tablet; pt has a scheduled appt on 7/16; please f/u with pt by phone @ [phone]

## 2013-12-20 ENCOUNTER — Ambulatory Visit: Payer: Self-pay | Admitting: Internal Medicine

## 2014-04-29 ENCOUNTER — Telehealth: Payer: Self-pay | Admitting: Internal Medicine

## 2014-05-06 ENCOUNTER — Ambulatory Visit: Payer: BLUE CROSS/BLUE SHIELD | Attending: Internal Medicine | Admitting: Internal Medicine

## 2014-05-06 VITALS — BP 140/92

## 2014-05-06 DIAGNOSIS — E785 Hyperlipidemia, unspecified: Secondary | ICD-10-CM | POA: Diagnosis not present

## 2014-05-06 DIAGNOSIS — I10 Essential (primary) hypertension: Secondary | ICD-10-CM | POA: Insufficient documentation

## 2014-05-06 LAB — POCT GLYCOSYLATED HEMOGLOBIN (HGB A1C): HEMOGLOBIN A1C: 5.9

## 2014-05-06 MED ORDER — CLONIDINE HCL 0.1 MG PO TABS
0.1000 mg | ORAL_TABLET | Freq: Once | ORAL | Status: AC
  Administered 2014-05-06: 0.1 mg via ORAL

## 2014-05-06 MED ORDER — LISINOPRIL 10 MG PO TABS: 10.0000 mg | ORAL_TABLET | Freq: Every day | ORAL | Status: DC

## 2014-05-06 MED ORDER — AMLODIPINE BESYLATE 10 MG PO TABS: 10.0000 mg | ORAL_TABLET | Freq: Every day | ORAL | Status: DC

## 2014-05-06 NOTE — Progress Notes (Signed)
Pt is here for a medication refill

## 2014-05-06 NOTE — Progress Notes (Signed)
Patient ID: Leslie Steele, female   DOB: [birth year], 53 y.o.   MRN: [id number]   Leslie Steele, is a 53 y.o. female  [id number]  [id number]  DOB - 12/10/1960  Chief Complaint  Patient presents with  . Follow-up        Subjective:   Leslie Steele is a 53 y.o. female here today for a follow up visit. Patient medical history significant for hypertension here for routine follow-up. The patient has no complaint today. Patient reports compliance with medications, reports no side effects. Patient has No headache, No chest pain, No abdominal pain - No Nausea, No new weakness tingling or numbness, No Cough - SOB.  No problems updated.  ALLERGIES: No Known Allergies  PAST MEDICAL HISTORY: Past Medical History  Diagnosis Date  . Hypertension     MEDICATIONS AT HOME: Prior to Admission medications   Medication Sig Start Date End Date Taking? Authorizing Provider  acetaminophen (TYLENOL) 500 MG tablet Take 1,000 mg by mouth every 4 (four) hours as needed for pain (headache).   Yes Historical Provider, MD  amLODipine (NORVASC) 10 MG tablet Take 1 tablet (10 mg total) by mouth daily. 05/06/14  Yes Tresa Garter, MD  ferrous sulfate 325 (65 FE) MG tablet Take 1 tablet (325 mg total) by mouth 2 (two) times daily with a meal. 06/11/13  Yes Ryanne Morand E Doreene Burke, MD  lisinopril (PRINIVIL,ZESTRIL) 10 MG tablet Take 1 tablet (10 mg total) by mouth daily. 05/06/14  Yes Tresa Garter, MD  Chlorpheniramine-DM (CORICIDIN COUGH/COLD) 4-30 MG TABS Take 2 tablets by mouth every 4 (four) hours as needed (for cold).    Historical Provider, MD  ciprofloxacin (CIPRO) 500 MG tablet Take 1 tablet (500 mg total) by mouth 2 (two) times daily. Patient not taking: Reported on 05/06/2014 06/11/13   Tresa Garter, MD     Objective:   There were no vitals filed for this visit.  Exam General appearance : Awake, alert, not in any distress. Speech Clear. Not toxic looking HEENT: Atraumatic and  Normocephalic, pupils equally reactive to light and accomodation Neck: supple, no JVD. No cervical lymphadenopathy.  Chest:Good air entry bilaterally, no added sounds  CVS: S1 S2 regular, no murmurs.  Abdomen: Bowel sounds present, Non tender and not distended with no gaurding, rigidity or rebound. Extremities: B/L Lower Ext shows no edema, both legs are warm to touch Neurology: Awake alert, and oriented X 3, CN II-XII intact, Non focal Skin:No Rash Wounds:N/A  Data Review No results found for: HGBA1C   Assessment & Plan   1. Essential hypertension  - amLODipine (NORVASC) 10 MG tablet; Take 1 tablet (10 mg total) by mouth daily.  Dispense: 90 tablet; Refill: 3 - lisinopril (PRINIVIL,ZESTRIL) 10 MG tablet; Take 1 tablet (10 mg total) by mouth daily.  Dispense: 90 tablet; Refill: 3 - COMPLETE METABOLIC PANEL WITH GFR - Lipid panel - POCT glycosylated hemoglobin (Hb A1C)  - We have discussed target BP range and blood pressure goal - I have advised patient to check BP regularly and to call us back or report to clinic if the numbers are consistently higher than 140/90  - We discussed the importance of compliance with medical therapy and DASH diet recommended, consequences of uncontrolled hypertension discussed.  - continue current BP medications  2. Dyslipidemia  To address this please limit saturated fat to no more than 7% of your calories, limit cholesterol to 200 mg/day, increase fiber and exercise as tolerated. If needed we may add  another cholesterol lowering medication to your regimen.    Return in about 6 months (around 11/04/2014), or if symptoms worsen or fail to improve, for Annual Physical, Follow up HTN.  The patient was given clear instructions to go to ER or return to medical center if symptoms don't improve, worsen or new problems develop. The patient verbalized understanding. The patient was told to call to get lab results if they haven't heard anything in the next week.    This note has been created with [employer], quantity. Any transcriptional errors are unintentional.    Angelica Chessman, MD, [place], [place], [place] and [place], [place]   05/06/2014, 6:02 PM

## 2014-05-06 NOTE — Patient Instructions (Signed)
Hypertension Hypertension, commonly called high blood pressure, is when the force of blood pumping through your arteries is too strong. Your arteries are the blood vessels that carry blood from your heart throughout your body. A blood pressure reading consists of a higher number over a lower number, such as 110/72. The higher number (systolic) is the pressure inside your arteries when your heart pumps. The lower number (diastolic) is the pressure inside your arteries when your heart relaxes. Ideally you want your blood pressure below 120/80. Hypertension forces your heart to work harder to pump blood. Your arteries may become narrow or stiff. Having hypertension puts you at risk for heart disease, stroke, and other problems.  RISK FACTORS Some risk factors for high blood pressure are controllable. Others are not.  Risk factors you cannot control include:   Race. You may be at higher risk if you are African American.  Age. Risk increases with age.  Gender. Men are at higher risk than women before age 45 years. After age 65, women are at higher risk than men. Risk factors you can control include:  Not getting enough exercise or physical activity.  Being overweight.  Getting too much fat, sugar, calories, or salt in your diet.  Drinking too much alcohol. SIGNS AND SYMPTOMS Hypertension does not usually cause signs or symptoms. Extremely high blood pressure (hypertensive crisis) may cause headache, anxiety, shortness of breath, and nosebleed. DIAGNOSIS  To check if you have hypertension, your health care provider will measure your blood pressure while you are seated, with your arm held at the level of your heart. It should be measured at least twice using the same arm. Certain conditions can cause a difference in blood pressure between your right and left arms. A blood pressure reading that is higher than normal on one occasion does not mean that you need treatment. If one blood pressure reading  is high, ask your health care provider about having it checked again. TREATMENT  Treating high blood pressure includes making lifestyle changes and possibly taking medicine. Living a healthy lifestyle can help lower high blood pressure. You may need to change some of your habits. Lifestyle changes may include:  Following the DASH diet. This diet is high in fruits, vegetables, and whole grains. It is low in salt, red meat, and added sugars.  Getting at least 2 hours of brisk physical activity every week.  Losing weight if necessary.  Not smoking.  Limiting alcoholic beverages.  Learning ways to reduce stress. If lifestyle changes are not enough to get your blood pressure under control, your health care provider may prescribe medicine. You may need to take more than one. Work closely with your health care provider to understand the risks and benefits. HOME CARE INSTRUCTIONS  Have your blood pressure rechecked as directed by your health care provider.   Take medicines only as directed by your health care provider. Follow the directions carefully. Blood pressure medicines must be taken as prescribed. The medicine does not work as well when you skip doses. Skipping doses also puts you at risk for problems.   Do not smoke.   Monitor your blood pressure at home as directed by your health care provider. SEEK MEDICAL CARE IF:   You think you are having a reaction to medicines taken.  You have recurrent headaches or feel dizzy.  You have swelling in your ankles.  You have trouble with your vision. SEEK IMMEDIATE MEDICAL CARE IF:  You develop a severe headache or confusion.    You have unusual weakness, numbness, or feel faint.  You have severe chest or abdominal pain.  You vomit repeatedly.  You have trouble breathing. MAKE SURE YOU:   Understand these instructions.  Will watch your condition.  Will get help right away if you are not doing well or get worse. Document  Released: 05/24/2005 Document Revised: 10/08/2013 Document Reviewed: 03/16/2013 ExitCare Patient Information 2015 [employer]. This information is not intended to replace advice given to you by your health care provider. Make sure you discuss any questions you have with your health care provider. DASH Eating Plan DASH stands for "Dietary Approaches to Stop Hypertension." The DASH eating plan is a healthy eating plan that has been shown to reduce high blood pressure (hypertension). Additional health benefits may include reducing the risk of type 2 diabetes mellitus, heart disease, and stroke. The DASH eating plan may also help with weight loss. WHAT DO I NEED TO KNOW ABOUT THE DASH EATING PLAN? For the DASH eating plan, you will follow these general guidelines:  Choose foods with a percent daily value for sodium of less than 5% (as listed on the food label).  Use salt-free seasonings or herbs instead of table salt or sea salt.  Check with your health care provider or pharmacist before using salt substitutes.  Eat lower-sodium products, often labeled as "lower sodium" or "no salt added."  Eat fresh foods.  Eat more vegetables, fruits, and low-fat dairy products.  Choose whole grains. Look for the word "whole" as the first word in the ingredient list.  Choose fish and skinless chicken or turkey more often than red meat. Limit fish, poultry, and meat to 6 oz (170 g) each day.  Limit sweets, desserts, sugars, and sugary drinks.  Choose heart-healthy fats.  Limit cheese to 1 oz (28 g) per day.  Eat more home-cooked food and less restaurant, buffet, and fast food.  Limit fried foods.  Cook foods using methods other than frying.  Limit canned vegetables. If you do use them, rinse them well to decrease the sodium.  When eating at a restaurant, ask that your food be prepared with less salt, or no salt if possible. WHAT FOODS CAN I EAT? Seek help from a dietitian for individual  calorie needs. Grains Whole grain or whole wheat bread. Brown rice. Whole grain or whole wheat pasta. Quinoa, bulgur, and whole grain cereals. Low-sodium cereals. Corn or whole wheat flour tortillas. Whole grain cornbread. Whole grain crackers. Low-sodium crackers. Vegetables Fresh or frozen vegetables (raw, steamed, roasted, or grilled). Low-sodium or reduced-sodium tomato and vegetable juices. Low-sodium or reduced-sodium tomato sauce and paste. Low-sodium or reduced-sodium canned vegetables.  Fruits All fresh, canned (in natural juice), or frozen fruits. Meat and Other Protein Products Ground beef (85% or leaner), grass-fed beef, or beef trimmed of fat. Skinless chicken or turkey. Ground chicken or turkey. Pork trimmed of fat. All fish and seafood. Eggs. Dried beans, peas, or lentils. Unsalted nuts and seeds. Unsalted canned beans. Dairy Low-fat dairy products, such as skim or 1% milk, 2% or reduced-fat cheeses, low-fat ricotta or cottage cheese, or plain low-fat yogurt. Low-sodium or reduced-sodium cheeses. Fats and Oils Tub margarines without trans fats. Light or reduced-fat mayonnaise and salad dressings (reduced sodium). Avocado. Safflower, olive, or canola oils. Natural peanut or almond butter. Other Unsalted popcorn and pretzels. The items listed above may not be a complete list of recommended foods or beverages. Contact your dietitian for more options. WHAT FOODS ARE NOT RECOMMENDED? Grains White bread.   White pasta. White rice. Refined cornbread. Bagels and croissants. Crackers that contain trans fat. Vegetables Creamed or fried vegetables. Vegetables in a cheese sauce. Regular canned vegetables. Regular canned tomato sauce and paste. Regular tomato and vegetable juices. Fruits Dried fruits. Canned fruit in light or heavy syrup. Fruit juice. Meat and Other Protein Products Fatty cuts of meat. Ribs, chicken wings, bacon, sausage, bologna, salami, chitterlings, fatback, hot dogs,  bratwurst, and packaged luncheon meats. Salted nuts and seeds. Canned beans with salt. Dairy Whole or 2% milk, cream, half-and-half, and cream cheese. Whole-fat or sweetened yogurt. Full-fat cheeses or blue cheese. Nondairy creamers and whipped toppings. Processed cheese, cheese spreads, or cheese curds. Condiments Onion and garlic salt, seasoned salt, table salt, and sea salt. Canned and packaged gravies. Worcestershire sauce. Tartar sauce. Barbecue sauce. Teriyaki sauce. Soy sauce, including reduced sodium. Steak sauce. Fish sauce. Oyster sauce. Cocktail sauce. Horseradish. Ketchup and mustard. Meat flavorings and tenderizers. Bouillon cubes. Hot sauce. Tabasco sauce. Marinades. Taco seasonings. Relishes. Fats and Oils Butter, stick margarine, lard, shortening, ghee, and bacon fat. Coconut, palm kernel, or palm oils. Regular salad dressings. Other Pickles and olives. Salted popcorn and pretzels. The items listed above may not be a complete list of foods and beverages to avoid. Contact your dietitian for more information. WHERE CAN I FIND MORE INFORMATION? National Heart, Lung, and Blood Institute: www.nhlbi.nih.gov/health/health-topics/topics/dash/ Document Released: 05/13/2011 Document Revised: 10/08/2013 Document Reviewed: 03/28/2013 ExitCare Patient Information 2015 [employer]. This information is not intended to replace advice given to you by your health care provider. Make sure you discuss any questions you have with your health care provider.  

## 2014-05-07 LAB — COMPLETE METABOLIC PANEL WITH GFR
ALT: 18 U/L (ref 0–35)
AST: 16 U/L (ref 0–37)
Albumin: 4.2 g/dL (ref 3.5–5.2)
Alkaline Phosphatase: 84 U/L (ref 39–117)
BILIRUBIN TOTAL: 0.6 mg/dL (ref 0.2–1.2)
BUN: 19 mg/dL (ref 6–23)
CHLORIDE: 104 meq/L (ref 96–112)
CO2: 23 mEq/L (ref 19–32)
Calcium: 9.3 mg/dL (ref 8.4–10.5)
Creat: 0.8 mg/dL (ref 0.50–1.10)
GFR, EST NON AFRICAN AMERICAN: 83 mL/min
GLUCOSE: 97 mg/dL (ref 70–99)
POTASSIUM: 3.6 meq/L (ref 3.5–5.3)
SODIUM: 138 meq/L (ref 135–145)
Total Protein: 7.1 g/dL (ref 6.0–8.3)

## 2014-05-07 LAB — LIPID PANEL
CHOLESTEROL: 192 mg/dL (ref 0–200)
HDL: 63 mg/dL (ref >39)
LDL CALC: 109 mg/dL — AB (ref 0–99)
Total CHOL/HDL Ratio: 3 Ratio
Triglycerides: 101 mg/dL (ref <150)
VLDL: 20 mg/dL (ref 0–40)

## 2014-05-08 ENCOUNTER — Telehealth: Payer: Self-pay | Admitting: *Deleted

## 2014-05-08 NOTE — Telephone Encounter (Signed)
Pt aware of lab results. Advised to exercise and low carbohydrate diet

## 2014-05-08 NOTE — Telephone Encounter (Signed)
-----   Message from Tresa Garter, MD sent at 05/08/2014 12:49 PM EST ----- Please inform patient that her laboratory test results are mostly within normal limit, there is a huge improvement in her cholesterol level. Her hemoglobin A1c shows that she is at risk of developing diabetes in the future, to reduce this risk. We advise low carbohydrate diet, and regular physical exercise at least 3 times a week 30 minutes each time

## 2014-05-09 ENCOUNTER — Ambulatory Visit: Payer: Self-pay | Admitting: Internal Medicine

## 2015-01-22 ENCOUNTER — Other Ambulatory Visit: Payer: Self-pay

## 2015-01-22 DIAGNOSIS — I10 Essential (primary) hypertension: Secondary | ICD-10-CM

## 2015-04-19 ENCOUNTER — Other Ambulatory Visit: Payer: Self-pay | Admitting: Internal Medicine

## 2015-04-22 NOTE — Telephone Encounter (Signed)
Nurse called patient, reached voicemail. Left message for patient to call Julitza Rickles with [facility], at [phone]. Nurse called patient to make her aware of need for appointment. Nurse will send refill for 1 month supply for lisinopril and amlodipine.

## 2019-05-11 ENCOUNTER — Ambulatory Visit (HOSPITAL_COMMUNITY)
Admission: EM | Admit: 2019-05-11 | Discharge: 2019-05-11 | Disposition: A | Discharge status: Home / Self Care | Payer: BLUE CROSS/BLUE SHIELD | Attending: Family Medicine | Admitting: Family Medicine

## 2019-05-11 ENCOUNTER — Other Ambulatory Visit: Payer: Self-pay

## 2019-05-11 ENCOUNTER — Encounter (HOSPITAL_COMMUNITY): Payer: Self-pay

## 2019-05-11 DIAGNOSIS — L02412 Cutaneous abscess of left axilla: Secondary | ICD-10-CM

## 2019-05-11 MED ORDER — IBUPROFEN 800 MG PO TABS: 800.0000 mg | ORAL_TABLET | Freq: Three times a day (TID) | ORAL | 0 refills | Status: DC

## 2019-05-11 MED ORDER — IBUPROFEN 800 MG PO TABS
ORAL_TABLET | ORAL | Status: AC
  Filled 2019-05-11: qty 1

## 2019-05-11 MED ORDER — CEPHALEXIN 500 MG PO CAPS: 500.0000 mg | ORAL_CAPSULE | Freq: Four times a day (QID) | ORAL | 0 refills | Status: DC

## 2019-05-11 MED ORDER — CEPHALEXIN 500 MG PO CAPS: 500.0000 mg | ORAL_CAPSULE | Freq: Four times a day (QID) | ORAL | 0 refills | Status: AC

## 2019-05-11 MED ORDER — IBUPROFEN 800 MG PO TABS
800.0000 mg | ORAL_TABLET | Freq: Once | ORAL | Status: AC
  Administered 2019-05-11: 20:00:00 800 mg via ORAL

## 2019-05-11 NOTE — ED Triage Notes (Signed)
Pt. Is here with a abscess under her left armpit, shes had this for 4 days.

## 2019-05-11 NOTE — ED Provider Notes (Signed)
[place]    CSN: [id number] Arrival date & time: 05/11/19  1741      History   Chief Complaint Chief Complaint  Patient presents with  . Abscess    HPI Leslie Steele is a 58 y.o. female.   Leslie Steele presents with complaints of abscess to left axilla which has been worsening over the past few days. Tried to open it unsuccessfully last night. No drainage. No fevers. Denies any previous similar. No MRSA history.     ROS per HPI, negative if not otherwise mentioned.      Past Medical History:  Diagnosis Date  . Hypertension     Patient Active Problem List   Diagnosis Date Noted  . UTI (urinary tract infection) 06/11/2013  . Dyslipidemia 06/11/2013  . Preventative health care 06/11/2013  . Essential hypertension 02/14/2013    Past Surgical History:  Procedure Laterality Date  . ABDOMINAL HYSTERECTOMY      OB History   No obstetric history on file.      Home Medications    Prior to Admission medications   Medication Sig Start Date End Date Taking? Authorizing Provider  losartan (COZAAR) 100 MG tablet Take 100 mg by mouth daily.   Yes [provider]  acetaminophen (TYLENOL) 500 MG tablet Take 1,000 mg by mouth every 4 (four) hours as needed for pain (headache).    [provider]  amLODipine (NORVASC) 10 MG tablet TAKE 1 TABLET BY MOUTH EVERY DAY 04/22/15   Tresa Garter, MD  cephALEXin (KEFLEX) 500 MG capsule Take 1 capsule (500 mg total) by mouth 4 (four) times daily for 7 days. 05/11/19 05/18/19  Zigmund Gottron, NP  Chlorpheniramine-DM (CORICIDIN COUGH/COLD) 4-30 MG TABS Take 2 tablets by mouth every 4 (four) hours as needed (for cold).    [provider]  ciprofloxacin (CIPRO) 500 MG tablet Take 1 tablet (500 mg total) by mouth 2 (two) times daily. Patient not taking: Reported on 05/06/2014 06/11/13   Tresa Garter, MD  ferrous sulfate 325 (65 FE) MG tablet Take 1 tablet (325 mg total) by mouth 2  (two) times daily with a meal. 06/11/13   Jegede, Olugbemiga E, MD  ibuprofen (ADVIL) 800 MG tablet Take 1 tablet (800 mg total) by mouth 3 (three) times daily. 05/11/19   Zigmund Gottron, NP  lisinopril (PRINIVIL,ZESTRIL) 10 MG tablet TAKE 1 TABLET BY MOUTH EVERY DAY 04/22/15   Tresa Garter, MD    Family History Family History  Problem Relation Age of Onset  . Hypertension Mother   . Hypertension Father   . Asthma Son     Social History Social History   Tobacco Use  . Smoking status: Never Smoker  . Smokeless tobacco: Never Used  Substance Use Topics  . Alcohol use: Yes    Comment: occ  . Drug use: No     Allergies   Patient has no known allergies.   Review of Systems Review of Systems   Physical Exam Triage Vital Signs ED Triage Vitals  Enc Vitals Group     BP 05/11/19 1914 (!) 179/103     Pulse Rate 05/11/19 1914 89     Resp 05/11/19 1914 20     Temp 05/11/19 1914 99.2 F (37.3 C)     Temp Source 05/11/19 1914 Oral     SpO2 05/11/19 1914 100 %     Weight 05/11/19 1911 190 lb (86.2 kg)     Height --  Head Circumference --      Peak Flow --      Pain Score 05/11/19 1911 10     Pain Loc --      Pain Edu? --      Excl. in [place]? --    No data found.  Updated Vital Signs BP (!) 179/103 (BP Location: Right Arm)   Pulse 89   Temp 99.2 F (37.3 C) (Oral)   Resp 20   Wt 190 lb (86.2 kg)   SpO2 100%   BMI 35.32 kg/m   Visual Acuity Right Eye Distance:   Left Eye Distance:   Bilateral Distance:    Right Eye Near:   Left Eye Near:    Bilateral Near:     Physical Exam Constitutional:      General: She is not in acute distress.    Appearance: She is well-developed.  Cardiovascular:     Rate and Rhythm: Normal rate.  Pulmonary:     Effort: Pulmonary effort is normal.  Skin:    General: Skin is warm and dry.     Comments: Left axilla with approximately 3 cm linear abscess with extending redness, tenderness; small purulent drainage on  palpation; just distal to this a deeper and small abscess palpable, no redness, no drainage; no induration   Neurological:     Mental Status: She is alert and oriented to person, place, and time.      UC Treatments / Results  Labs (all labs ordered are listed, but only abnormal results are displayed) Labs Reviewed - No data to display  EKG   Radiology No results found.  Procedures Incision and Drainage  Date/Time: 05/11/2019 8:27 PM Performed by: Zigmund Gottron, NP Authorized by: Robyn Haber, MD   Consent:    Consent obtained:  Verbal   Consent given by:  Patient   Risks discussed:  Bleeding, infection, incomplete drainage and pain   Alternatives discussed:  No treatment, alternative treatment, observation and referral Location:    Type:  Abscess   Size:  3   Location: left axillary. Pre-procedure details:    Skin preparation:  Betadine Anesthesia (see MAR for exact dosages):    Anesthesia method:  Local infiltration   Local anesthetic:  Lidocaine 2% w/o epi Procedure type:    Complexity:  Simple Procedure details:    Incision types:  Single straight   Scalpel blade:  11   Wound management:  Probed and deloculated   Drainage:  Bloody and purulent   Drainage amount:  Moderate   Wound treatment:  Wound left open   Packing materials:  None Post-procedure details:    Patient tolerance of procedure:  Tolerated well, no immediate complications   (including critical care time)  Medications Ordered in UC Medications  ibuprofen (ADVIL) tablet 800 mg (800 mg Oral Given 05/11/19 2001)  ibuprofen (ADVIL) 800 MG tablet (has no administration in time range)    Initial Impression / Assessment and Plan / UC Course  I have reviewed the triage vital signs and the nursing notes.  Pertinent labs & imaging results that were available during my care of the patient were reviewed by me and considered in my medical decision making (see chart for details).     Incision  and drainage of more proximal abscess. Post I&D care provided. Keflex provided. Return precautions provided, especially in regards to second abscess as it may warrant incision in the future. Warm compresses encouraged. Patient verbalized understanding and agreeable to plan.  Final Clinical Impressions(s) / UC Diagnoses   Final diagnoses:  Abscess of left axilla     Discharge Instructions     Complete course of antibiotics.  Tylenol and/or ibuprofen as needed for pain.  Keep dressing in place for the next 24 hours. May remove tomorrow.  Then cleanse area daily with soap and water.  Keep covered to keep protected and collect drainage.  If symptoms worsen or do not improve in the next week to return to be seen or to follow up with your PCP.      ED Prescriptions    Medication Sig Dispense Auth. Provider   cephALEXin (KEFLEX) 500 MG capsule  (Status: Discontinued) Take 1 capsule (500 mg total) by mouth 4 (four) times daily for 7 days. 28 capsule Augusto Gamble B, NP   ibuprofen (ADVIL) 800 MG tablet  (Status: Discontinued) Take 1 tablet (800 mg total) by mouth 3 (three) times daily. 21 tablet Augusto Gamble B, NP   ibuprofen (ADVIL) 800 MG tablet Take 1 tablet (800 mg total) by mouth 3 (three) times daily. 21 tablet Augusto Gamble B, NP   cephALEXin (KEFLEX) 500 MG capsule Take 1 capsule (500 mg total) by mouth 4 (four) times daily for 7 days. 28 capsule Zigmund Gottron, NP     PDMP not reviewed this encounter.   Zigmund Gottron, NP 05/11/19 2028

## 2019-05-11 NOTE — Discharge Instructions (Addendum)
Complete course of antibiotics.  Tylenol and/or ibuprofen as needed for pain.  Keep dressing in place for the next 24 hours. May remove tomorrow.  Then cleanse area daily with soap and water.  Keep covered to keep protected and collect drainage.  If symptoms worsen or do not improve in the next week to return to be seen or to follow up with your PCP.

## 2019-11-09 ENCOUNTER — Encounter (HOSPITAL_COMMUNITY): Payer: Self-pay

## 2019-11-09 ENCOUNTER — Other Ambulatory Visit: Payer: Self-pay

## 2019-11-09 ENCOUNTER — Ambulatory Visit (HOSPITAL_COMMUNITY)
Admission: EM | Admit: 2019-11-09 | Discharge: 2019-11-09 | Disposition: A | Discharge status: Home / Self Care | Payer: Medicaid Other | Attending: Family Medicine | Admitting: Family Medicine

## 2019-11-09 DIAGNOSIS — I10 Essential (primary) hypertension: Secondary | ICD-10-CM

## 2019-11-09 DIAGNOSIS — Z760 Encounter for issue of repeat prescription: Secondary | ICD-10-CM

## 2019-11-09 DIAGNOSIS — G44209 Tension-type headache, unspecified, not intractable: Secondary | ICD-10-CM

## 2019-11-09 MED ORDER — LOSARTAN POTASSIUM 100 MG PO TABS: 100.0000 mg | ORAL_TABLET | Freq: Every day | ORAL | 0 refills | Status: DC

## 2019-11-09 NOTE — Discharge Instructions (Addendum)
See [facility] in follow up

## 2019-11-09 NOTE — ED Provider Notes (Signed)
[place]    CSN: [id number] Arrival date & time: 11/09/19  1059      History   Chief Complaint Chief Complaint  Patient presents with  . Hypertension    HPI Leslie Steele is a 59 y.o. female.   HPI   Patient has known hypertension She has been out of her medication for several weeks She has noticed that her blood pressure is gradually been increasing The last 2 days she has had headaches.  That stimulated her to come in for medical care No visual symptoms, neuro symptoms, weakness, problems with balance, strength, or mentation Patient states that she has gone to the [facility] and has signed up for medical care there for the future She needs a refill of her Cozaar until she can get in with the clinic She has not had blood work for some time.  Never history of kidney problems.  Past Medical History:  Diagnosis Date  . Hypertension     Patient Active Problem List   Diagnosis Date Noted  . UTI (urinary tract infection) 06/11/2013  . Dyslipidemia 06/11/2013  . Preventative health care 06/11/2013  . Essential hypertension 02/14/2013    Past Surgical History:  Procedure Laterality Date  . ABDOMINAL HYSTERECTOMY      OB History   No obstetric history on file.      Home Medications    Prior to Admission medications   Medication Sig Start Date End Date Taking? Authorizing Provider  acetaminophen (TYLENOL) 500 MG tablet Take 1,000 mg by mouth every 4 (four) hours as needed for pain (headache).    [provider]  ferrous sulfate 325 (65 FE) MG tablet Take 1 tablet (325 mg total) by mouth 2 (two) times daily with a meal. 06/11/13   Jegede, Olugbemiga E, MD  ibuprofen (ADVIL) 800 MG tablet Take 1 tablet (800 mg total) by mouth 3 (three) times daily. 05/11/19   Zigmund Gottron, NP  losartan (COZAAR) 100 MG tablet Take 1 tablet (100 mg total) by mouth daily. 11/09/19   Raylene Everts, MD  amLODipine (NORVASC) 10 MG  tablet TAKE 1 TABLET BY MOUTH EVERY DAY 04/22/15 11/09/19  Tresa Garter, MD  lisinopril (PRINIVIL,ZESTRIL) 10 MG tablet TAKE 1 TABLET BY MOUTH EVERY DAY 04/22/15 11/09/19  Tresa Garter, MD    Family History Family History  Problem Relation Age of Onset  . Hypertension Mother   . Hypertension Father   . Asthma Son     Social History Social History   Tobacco Use  . Smoking status: Never Smoker  . Smokeless tobacco: Never Used  Substance Use Topics  . Alcohol use: Yes    Comment: occ  . Drug use: No     Allergies   Lisinopril   Review of Systems Review of Systems  Eyes: Negative for photophobia and visual disturbance.  Cardiovascular: Negative for chest pain, palpitations and leg swelling.  Gastrointestinal: Negative for nausea.  Neurological: Negative for headaches.     Physical Exam Triage Vital Signs ED Triage Vitals  Enc Vitals Group     BP 11/09/19 1151 (!) 157/103     Pulse Rate 11/09/19 1151 79     Resp 11/09/19 1151 18     Temp 11/09/19 1151 98.2 F (36.8 C)     Temp Source 11/09/19 1151 Oral     SpO2 11/09/19 1151 100 %     Weight --      Height --  Head Circumference --      Peak Flow --      Pain Score 11/09/19 1147 6     Pain Loc --      Pain Edu? --      Excl. in [place]? --    No data found.  Updated Vital Signs BP (!) 157/103 (BP Location: Right Arm)   Pulse 79   Temp 98.2 F (36.8 C) (Oral)   Resp 18   SpO2 100%      Physical Exam Constitutional:      General: She is not in acute distress.    Appearance: She is well-developed.     Comments: Pleasant cooperative  HENT:     Head: Normocephalic and atraumatic.     Mouth/Throat:     Comments: Mask is in place Eyes:     Conjunctiva/sclera: Conjunctivae normal.     Pupils: Pupils are equal, round, and reactive to light.  Cardiovascular:     Rate and Rhythm: Normal rate.  Pulmonary:     Effort: Pulmonary effort is normal. No respiratory distress.  Musculoskeletal:         General: Normal range of motion.     Cervical back: Normal range of motion.  Skin:    General: Skin is warm and dry.  Neurological:     Mental Status: She is alert.  Psychiatric:        Mood and Affect: Mood normal.        Behavior: Behavior normal.      UC Treatments / Results  Labs (all labs ordered are listed, but only abnormal results are displayed) Labs Reviewed - No data to display  EKG   Radiology No results found.  Procedures Procedures (including critical care time)  Medications Ordered in UC Medications - No data to display  Initial Impression / Assessment and Plan / UC Course  I have reviewed the triage vital signs and the nursing notes.  Pertinent labs & imaging results that were available during my care of the patient were reviewed by me and considered in my medical decision making (see chart for details).     As of staying on blood pressure medicine is reviewed for patient.  Risk of heart disease and stroke is increased off of medication.  Follow-up as already arranged. Final Clinical Impressions(s) / UC Diagnoses   Final diagnoses:  Essential hypertension  Tension headache  Encounter for medication refill     Discharge Instructions     See [facility] in follow up   ED Prescriptions    Medication Sig Dispense Auth. Provider   losartan (COZAAR) 100 MG tablet Take 1 tablet (100 mg total) by mouth daily. 90 tablet Raylene Everts, MD     PDMP not reviewed this encounter.   Raylene Everts, MD 11/09/19 [phone]

## 2019-11-09 NOTE — ED Triage Notes (Signed)
Pt reports that she has had elevated BP for several weeks and is worse the past few days. Pt reports that this morning's pressure was in 190s/120s. Also reports HA, tension at back of neck.  Denies CP, SOB, dizziness, blurred vision or extremity weakness.  Pt states she is out of her HTN Rx and was using it "sparingly" to spread out the medications.

## 2019-11-13 ENCOUNTER — Other Ambulatory Visit: Payer: Self-pay

## 2019-11-13 ENCOUNTER — Encounter (HOSPITAL_COMMUNITY): Payer: Self-pay

## 2019-11-13 ENCOUNTER — Emergency Department (HOSPITAL_COMMUNITY)
Admission: EM | Admit: 2019-11-13 | Discharge: 2019-11-14 | Disposition: A | Discharge status: Home / Self Care | Payer: Medicaid Other | Attending: Emergency Medicine | Admitting: Emergency Medicine

## 2019-11-13 DIAGNOSIS — I10 Essential (primary) hypertension: Secondary | ICD-10-CM | POA: Insufficient documentation

## 2019-11-13 DIAGNOSIS — Z79899 Other long term (current) drug therapy: Secondary | ICD-10-CM | POA: Insufficient documentation

## 2019-11-13 MED ORDER — AMLODIPINE BESYLATE 5 MG PO TABS: 5.0000 mg | ORAL_TABLET | Freq: Every day | ORAL | 0 refills | Status: DC

## 2019-11-13 NOTE — ED Triage Notes (Signed)
Pt arrives POV for eval of ongoing HTN. Pt was seen at [facility] for eval of same on 6/4, they restarted her on her home losartan and she stated it is not help. Endorses HA. Denies CP, dizziness, blurry vision, or weakness

## 2019-11-13 NOTE — Discharge Instructions (Signed)
Keep taking the losartan, as prescribed. Add in the amlodipine.  May try taking this at night since this is when your headaches arise and your blood pressure increases. Keep your scheduled appointment with your primary care provider. Return to the emergency department for chest pain, shortness of breath, passing out, persistent dizziness, vision changes, or any other major concerns.

## 2019-11-13 NOTE — ED Provider Notes (Signed)
[facility] EMERGENCY DEPARTMENT Provider Note   CSN: [id number] Arrival date & time: 11/13/19  2050     History Chief Complaint  Patient presents with  . Hypertension    Leslie Steele is a 59 y.o. female.  HPI      Leslie Steele is a 59 y.o. female, with a history of HTN, presenting to the ED with concerns for hypertension as well as headaches. Patient states she has been having bilateral, throbbing, moderate to severe headaches intermittently for several months.  She states her headaches have not changed, she is just tired of having them.  She states she was previously taking losartan and amlodipine for high blood pressure.  She was removed from the amlodipine because her doctor told her she no longer needed it.  She had not taken the losartan for a few months voicing concerns over cost and lack of PCP at the time. However, she states she has an appointment with [facility] on July 12.  Patient was seen at an urgent care on June 4 for the same complaint.  She was started back on her losartan.  She has been taking this as prescribed in the morning. She states she notices her blood pressure rises in the evening and this blood pressure rise tends to be accompanied by headaches in the late evening and morning before she takes her losartan.   Denies fever, chest pain, shortness of breath, syncope, sustained dizziness, vision changes, neurologic deficits, changes in urinary function, or any other complaints.    Past Medical History:  Diagnosis Date  . Hypertension     Patient Active Problem List   Diagnosis Date Noted  . UTI (urinary tract infection) 06/11/2013  . Dyslipidemia 06/11/2013  . Preventative health care 06/11/2013  . Essential hypertension 02/14/2013    Past Surgical History:  Procedure Laterality Date  . ABDOMINAL HYSTERECTOMY       OB History   No obstetric history on file.     Family History  Problem Relation  Age of Onset  . Hypertension Mother   . Hypertension Father   . Asthma Son     Social History   Tobacco Use  . Smoking status: Never Smoker  . Smokeless tobacco: Never Used  Substance Use Topics  . Alcohol use: Yes    Comment: occ  . Drug use: No    Home Medications Prior to Admission medications   Medication Sig Start Date End Date Taking? Authorizing Provider  acetaminophen (TYLENOL) 500 MG tablet Take 1,000 mg by mouth every 4 (four) hours as needed for pain (headache).   Yes [provider]  ELDERBERRY PO Take 2 capsules by mouth daily.   Yes [provider]  fluticasone (FLONASE) 50 MCG/ACT nasal spray Place 2 sprays into both nostrils daily.   Yes [provider]  losartan (COZAAR) 100 MG tablet Take 1 tablet (100 mg total) by mouth daily. 11/09/19  Yes Raylene Everts, MD  OVER THE COUNTER MEDICATION Take 8 oz by mouth daily. Beet juice   Yes [provider]  Turmeric (QC TUMERIC COMPLEX PO) Take 2 capsules by mouth daily.   Yes [provider]  amLODipine (NORVASC) 5 MG tablet Take 1 tablet (5 mg total) by mouth daily. 11/13/19   Hamsini Verrilli C, PA-C  ferrous sulfate 325 (65 FE) MG tablet Take 1 tablet (325 mg total) by mouth 2 (two) times daily with a meal. Patient not taking: Reported on  11/13/2019 06/11/13   Tresa Garter, MD  ibuprofen (ADVIL) 800 MG tablet Take 1 tablet (800 mg total) by mouth 3 (three) times daily. Patient not taking: Reported on 11/13/2019 05/11/19   Augusto Gamble B, NP  lisinopril (PRINIVIL,ZESTRIL) 10 MG tablet TAKE 1 TABLET BY MOUTH EVERY DAY 04/22/15 11/09/19  Tresa Garter, MD    Allergies    Lisinopril  Review of Systems   Review of Systems  Constitutional: Negative for chills, diaphoresis and fever.  Eyes: Negative for visual disturbance.  Respiratory: Negative for shortness of breath.   Cardiovascular: Negative for chest pain and leg swelling.  Gastrointestinal: Negative for abdominal  pain, nausea and vomiting.  Neurological: Positive for headaches. Negative for syncope, weakness and numbness.  Psychiatric/Behavioral: Negative for confusion.  All other systems reviewed and are negative.   Physical Exam Updated Vital Signs BP (!) 159/102   Pulse 91   Temp 98 F (36.7 C)   Resp 15   Ht 5' 1.5" (1.562 m)   Wt 86.2 kg   SpO2 98%   BMI 35.33 kg/m   Physical Exam Vitals and nursing note reviewed.  Constitutional:      General: She is not in acute distress.    Appearance: She is well-developed. She is not diaphoretic.  HENT:     Head: Normocephalic and atraumatic.     Mouth/Throat:     Mouth: Mucous membranes are moist.     Pharynx: Oropharynx is clear.  Eyes:     Conjunctiva/sclera: Conjunctivae normal.  Cardiovascular:     Rate and Rhythm: Normal rate and regular rhythm.     Pulses: Normal pulses.          Radial pulses are 2+ on the right side and 2+ on the left side.       Posterior tibial pulses are 2+ on the right side and 2+ on the left side.     Heart sounds: Normal heart sounds.     Comments: Tactile temperature in the extremities appropriate and equal bilaterally. Pulmonary:     Effort: Pulmonary effort is normal. No respiratory distress.     Breath sounds: Normal breath sounds.  Abdominal:     Tenderness: There is no guarding.  Musculoskeletal:     Cervical back: Neck supple.     Right lower leg: No edema.     Left lower leg: No edema.  Lymphadenopathy:     Cervical: No cervical adenopathy.  Skin:    General: Skin is warm and dry.  Neurological:     Mental Status: She is alert and oriented to person, place, and time.     Comments: No noted acute cognitive deficit. Sensation grossly intact to light touch in the extremities.   Grip strengths equal bilaterally.   Strength 5/5 in all extremities.  No gait disturbance.  Coordination intact.  Cranial nerves III-XII grossly intact.  Handles oral secretions without noted difficulty.  No  noted phonation or speech deficit. No facial droop.   Psychiatric:        Mood and Affect: Mood and affect normal.        Speech: Speech normal.        Behavior: Behavior normal.     ED Results / Procedures / Treatments   Labs (all labs ordered are listed, but only abnormal results are displayed) Labs Reviewed - No data to display  EKG None  ED ECG REPORT   Date: 11/14/2019  Rate: 83  Rhythm: normal sinus rhythm  QRS Axis: normal  Intervals: normal  ST/T Wave abnormalities: normal  Conduction Disutrbances:none  Narrative Interpretation:   Old EKG Reviewed: unchanged  I have personally reviewed the EKG tracing and agree with the computerized printout as noted.  Radiology No results found.  Procedures Procedures (including critical care time)  Medications Ordered in ED Medications - No data to display  ED Course  I have reviewed the triage vital signs and the nursing notes.  Pertinent labs & imaging results that were available during my care of the patient were reviewed by me and considered in my medical decision making (see chart for details).    MDM Rules/Calculators/A&P                      Patient presents with concerns for hypertension and headaches. No focal neurologic deficits.  Low suspicion for hypertensive emergency. Since the patient had been on amlodipine as well as the losartan in the past, we will add this back on to her regimen, especially since she will have follow-up with a PCP.  However this PCP follow-up is far enough out that I do not want the patient to suffer until then. The pattern of her headaches arising in the late evening and early morning hours seems to imply that her losartan is possibly wearing off.  We will try her taking the amlodipine in the evening.  The patient was given instructions for home care as well as return precautions. Patient voices understanding of these instructions, accepts the plan, and is comfortable with  discharge.  Vitals:   11/13/19 2105 11/13/19 2109 11/13/19 2342 11/13/19 2358  BP:  (!) 159/102 (!) 179/118 (!) 167/103  Pulse:  91 77   Resp:  15 15   Temp:  98 F (36.7 C) 98.2 F (36.8 C)   TempSrc:   Oral   SpO2:  98% 98%   Weight: 86.2 kg     Height: 5' 1.5" (1.562 m)        Final Clinical Impression(s) / ED Diagnoses Final diagnoses:  Hypertension, unspecified type    Rx / DC Orders ED Discharge Orders         Ordered    amLODipine (NORVASC) 5 MG tablet  Daily     11/13/19 2338           Lorayne Bender, PA-C 11/14/19 0037    Drenda Freeze, MD 11/20/19 1456

## 2019-11-14 NOTE — ED Notes (Signed)
Pt verbalizes understanding of d/c instructions. Prescriptions reviewed with patient. Pt ambulatory at d/c with all belongings.  

## 2019-12-18 ENCOUNTER — Encounter: Payer: Self-pay | Admitting: Nurse Practitioner

## 2019-12-18 ENCOUNTER — Other Ambulatory Visit: Payer: Self-pay | Admitting: Nurse Practitioner

## 2019-12-18 ENCOUNTER — Ambulatory Visit: Payer: Self-pay | Attending: Nurse Practitioner | Admitting: Nurse Practitioner

## 2019-12-18 ENCOUNTER — Other Ambulatory Visit: Payer: Self-pay

## 2019-12-18 VITALS — Ht 62.0 in | Wt 171.0 lb

## 2019-12-18 DIAGNOSIS — Z7689 Persons encountering health services in other specified circumstances: Secondary | ICD-10-CM

## 2019-12-18 DIAGNOSIS — F32A Depression, unspecified: Secondary | ICD-10-CM

## 2019-12-18 DIAGNOSIS — E559 Vitamin D deficiency, unspecified: Secondary | ICD-10-CM

## 2019-12-18 DIAGNOSIS — F419 Anxiety disorder, unspecified: Secondary | ICD-10-CM

## 2019-12-18 DIAGNOSIS — F329 Major depressive disorder, single episode, unspecified: Secondary | ICD-10-CM

## 2019-12-18 DIAGNOSIS — Z1159 Encounter for screening for other viral diseases: Secondary | ICD-10-CM

## 2019-12-18 DIAGNOSIS — J309 Allergic rhinitis, unspecified: Secondary | ICD-10-CM

## 2019-12-18 DIAGNOSIS — E785 Hyperlipidemia, unspecified: Secondary | ICD-10-CM

## 2019-12-18 DIAGNOSIS — I10 Essential (primary) hypertension: Secondary | ICD-10-CM

## 2019-12-18 DIAGNOSIS — Z130 Encounter for screening for diseases of the blood and blood-forming organs and certain disorders involving the immune mechanism: Secondary | ICD-10-CM

## 2019-12-18 DIAGNOSIS — Z114 Encounter for screening for human immunodeficiency virus [HIV]: Secondary | ICD-10-CM

## 2019-12-18 DIAGNOSIS — Z131 Encounter for screening for diabetes mellitus: Secondary | ICD-10-CM

## 2019-12-18 DIAGNOSIS — Z1211 Encounter for screening for malignant neoplasm of colon: Secondary | ICD-10-CM

## 2019-12-18 MED ORDER — ESCITALOPRAM OXALATE 10 MG PO TABS: 10.0000 mg | ORAL_TABLET | Freq: Every day | ORAL | 3 refills | Status: DC

## 2019-12-18 MED ORDER — LOSARTAN POTASSIUM 100 MG PO TABS: 100.0000 mg | ORAL_TABLET | Freq: Every day | ORAL | 0 refills | Status: DC

## 2019-12-18 MED ORDER — FLUTICASONE PROPIONATE 50 MCG/ACT NA SUSP: 2.0000 | Freq: Every day | NASAL | 2 refills | Status: DC

## 2019-12-18 MED ORDER — BUPROPION HCL ER (XL) 150 MG PO TB24: 150.0000 mg | ORAL_TABLET | Freq: Every day | ORAL | 3 refills | Status: DC

## 2019-12-18 MED ORDER — AMLODIPINE BESYLATE 10 MG PO TABS: 10.0000 mg | ORAL_TABLET | Freq: Every day | ORAL | 1 refills | Status: DC

## 2019-12-18 MED FILL — LOSARTAN POTASSIUM 100 MG T: 100 | 30 days supply | Qty: 30 | Fill #0

## 2019-12-18 MED FILL — ESCITALOPRAM 10 MG TABLET: 10 | 30 days supply | Qty: 30 | Fill #0

## 2019-12-18 MED FILL — AMLODIPINE BESYLATE 10 MG T: 10 | 30 days supply | Qty: 30 | Fill #0

## 2019-12-18 MED FILL — BUPROPION HCL XL 150 MG TAB: 150 | 30 days supply | Qty: 30 | Fill #0

## 2019-12-18 MED FILL — FLUTICASONE PROP 50 MCG SPR: 50 | 30 days supply | Qty: 16 | Fill #0

## 2019-12-18 NOTE — Progress Notes (Signed)
Virtual Visit via Telephone Note Due to national recommendations of social distancing due to Chris[place] 19, telehealth visit is felt to be most appropriate for this patient at this time.  I discussed the limitations, risks, security and privacy concerns of performing an evaluation and management service by telephone and the availability of in person appointments. I also discussed with the patient that there may be a patient responsible charge related to this service. The patient expressed understanding and agreed to proceed.    I connected with Leslie Steele on 12/18/19  at   1:50 PM EDT  EDT by telephone and verified that I am speaking with the correct person using two identifiers.   Consent I discussed the limitations, risks, security and privacy concerns of performing an evaluation and management service by telephone and the availability of in person appointments. I also discussed with the patient that there may be a patient responsible charge related to this service. The patient expressed understanding and agreed to proceed.   Location of Patient: Private Residence   Location of Provider: [place] [facility] [place] participating in Telemedicine visit: [name] FNP-BC [place]    History of Present Illness: Telemedicine visit for: Establish Care She moved back to  last year after moving back from [place].  Patient has been counseled on age-appropriate routine health concerns for screening and prevention. These are reviewed and up-to-date. Referrals have been placed accordingly. Immunizations are up-to-date or declined.    Mammogram: [facility] for [facility] program Colonoscopy: FIT test ordered. She is uninsured. PAP smear: Overdue. To be scheduled   She has a history of HTN and Dyslipidemia  Essential Hypertension Previous medications include Amlodipine 10 mg daily and lisinopril 10 mg daily (ACE INTOLERANCE)  losartan 100 mg daily. Denies chest pain, shortness of breath, palpitations, lightheadedness, dizziness, headaches or BLE edema. She monitors her blood pressure at home. 150/108-110s. Will restart amlodipine and losartan today.  BP Readings from Last 3 Encounters:  11/13/19 (!) 167/103  11/09/19 (!) 157/103  05/11/19 (!) 179/103    Dyslipidemia States she was taken off cholesterol medication in the past. Denies previous statin intolerance.  Lab Results  Component Value Date   CHOL 192 05/06/2014   CHOL 246 (H) 03/02/2013   Lab Results  Component Value Date   HDL 63 05/06/2014   HDL 58 03/02/2013   Lab Results  Component Value Date   LDLCALC 109 (H) 05/06/2014   LDLCALC 168 (H) 03/02/2013   Lab Results  Component Value Date   TRIG 101 05/06/2014   TRIG 101 03/02/2013   Lab Results  Component Value Date   CHOLHDL 3.0 05/06/2014   CHOLHDL 4.2 03/02/2013   Depression and Anxiety Was taking wellbutrin and lexapro in the past but can not recall the dosages. Both were effective. Currently denies any thoughts of self harm.   Past Medical History:  Diagnosis Date  . Depression   . Hypertension     Past Surgical History:  Procedure Laterality Date  . ABDOMINAL HYSTERECTOMY    . DG THUMB LEFT HAND     screws in hand    Family History  Problem Relation Age of Onset  . Hypertension Mother   . Breast cancer Mother   . Asthma Mother   . Hypertension Father   . Aneurysm Father   . Prostate cancer Father   . Asthma Son     Social History  Socioeconomic History  . Marital status: Single    Spouse name: Not on file  . Number of children: Not on file  . Years of education: Not on file  . Highest education level: Not on file  Occupational History  . Not on file  Tobacco Use  . Smoking status: Never Smoker  . Smokeless tobacco: Never Used  Substance and Sexual Activity  . Alcohol use: Yes    Comment: occ  . Drug use: No  . Sexual activity: Not Currently  Other  Topics Concern  . Not on file  Social History Narrative  . Not on file   Social Determinants of Health   Financial Resource Strain:   . Difficulty of Paying Living Expenses:   Food Insecurity:   . Worried About Charity fundraiser in the Last Year:   . Arboriculturist in the Last Year:   Transportation Needs:   . Film/video editor (Medical):   Marland Kitchen Lack of Transportation (Non-Medical):   Physical Activity:   . Days of Exercise per Week:   . Minutes of Exercise per Session:   Stress:   . Feeling of Stress :   Social Connections:   . Frequency of Communication with Friends and Family:   . Frequency of Social Gatherings with Friends and Family:   . Attends Religious Services:   . Active Member of Clubs or Organizations:   . Attends Archivist Meetings:   Marland Kitchen Marital Status:      Observations/Objective: Awake, alert and oriented x 3   Review of Systems  Constitutional: Negative for fever, malaise/fatigue and weight loss.  HENT: Positive for congestion. Negative for nosebleeds.   Eyes: Negative.  Negative for blurred vision, double vision and photophobia.  Respiratory: Negative.  Negative for cough and shortness of breath.   Cardiovascular: Negative.  Negative for chest pain, palpitations and leg swelling.  Gastrointestinal: Negative.  Negative for heartburn, nausea and vomiting.  Musculoskeletal: Negative.  Negative for myalgias.  Neurological: Positive for headaches (needs to get BP under control). Negative for dizziness, focal weakness and seizures.  Endo/Heme/Allergies: Positive for environmental allergies.  Psychiatric/Behavioral: Positive for depression. Negative for suicidal ideas. The patient is nervous/anxious.     Assessment and Plan: Leslie Steele was seen today for new patient (initial visit).  Diagnoses and all orders for this visit:  Encounter to establish care  Essential hypertension -     losartan (COZAAR) 100 MG tablet; Take 1 tablet (100 mg total)  by mouth daily. -     amLODipine (NORVASC) 10 MG tablet; Take 1 tablet (10 mg total) by mouth daily. -     CMP14+EGFR -     TSH Continue all antihypertensives as prescribed.  Remember to bring in your blood pressure log with you for your follow up appointment.  DASH/Mediterranean Diets are healthier choices for HTN.    Vitamin D deficiency disease -     VITAMIN D 25 Hydroxy (Vit-D Deficiency, Fractures)  Encounter for screening for HIV -     HIV antibody (with reflex)  Encounter for hepatitis C screening test for low risk patient -     Hepatitis C Antibody  Dyslipidemia -     Lipid panel  Screening for deficiency anemia -     CBC  Encounter for screening for diabetes mellitus -     Hemoglobin A1c  Colon cancer screening -     Fecal occult blood, imunochemical([facility]); Future  Anxiety and depression -  escitalopram (LEXAPRO) 10 MG tablet; Take 1 tablet (10 mg total) by mouth daily. -     buPROPion (WELLBUTRIN XL) 150 MG 24 hr tablet; Take 1 tablet (150 mg total) by mouth daily.  Allergic rhinitis, unspecified seasonality, unspecified trigger -     fluticasone (FLONASE) 50 MCG/ACT nasal spray; Place 2 sprays into both nostrils daily.     Follow Up Instructions Return in about 4 weeks (around 01/15/2020) for BP recheck.     I discussed the assessment and treatment plan with the patient. The patient was provided an opportunity to ask questions and all were answered. The patient agreed with the plan and demonstrated an understanding of the instructions.   The patient was advised to call back or seek an in-person evaluation if the symptoms worsen or if the condition fails to improve as anticipated.  I provided 20 minutes of non-face-to-face time during this encounter including median intraservice time, reviewing previous notes, labs, imaging, medications and explaining diagnosis and management.  Gildardo Pounds, FNP-BC

## 2019-12-19 ENCOUNTER — Other Ambulatory Visit: Payer: Self-pay | Admitting: Nurse Practitioner

## 2019-12-19 DIAGNOSIS — Z0000 Encounter for general adult medical examination without abnormal findings: Secondary | ICD-10-CM

## 2019-12-19 LAB — CMP14+EGFR
ALT: 21 IU/L (ref 0–32)
AST: 21 IU/L (ref 0–40)
Albumin/Globulin Ratio: 1.9 (ref 1.2–2.2)
Albumin: 3.7 g/dL — ABNORMAL LOW (ref 3.8–4.8)
Alkaline Phosphatase: 75 IU/L (ref 48–121)
BUN/Creatinine Ratio: 18 (ref 12–28)
BUN: 16 mg/dL (ref 8–27)
Bilirubin Total: 0.5 mg/dL (ref 0.0–1.2)
CO2: 23 mmol/L (ref 20–29)
Calcium: 9.9 mg/dL (ref 8.7–10.3)
Chloride: 108 mmol/L — ABNORMAL HIGH (ref 96–106)
Creatinine, Ser: 0.91 mg/dL (ref 0.57–1.00)
GFR calc Af Amer: 79 mL/min/{1.73_m2} (ref >59)
GFR calc non Af Amer: 68 mL/min/{1.73_m2} (ref >59)
Globulin, Total: 1.9 g/dL (ref 1.5–4.5)
Glucose: 93 mg/dL (ref 65–99)
Potassium: 4.2 mmol/L (ref 3.5–5.2)
Sodium: 144 mmol/L (ref 134–144)
Total Protein: 5.6 g/dL — ABNORMAL LOW (ref 6.0–8.5)

## 2019-12-19 LAB — HEPATITIS C ANTIBODY: Hep C Virus Ab: <0.1 s/co ratio (ref 0.0–0.9)

## 2019-12-19 LAB — HIV ANTIBODY (ROUTINE TESTING W REFLEX): HIV Screen 4th Generation wRfx: NONREACTIVE

## 2019-12-19 LAB — LIPID PANEL
Chol/HDL Ratio: 4 ratio (ref 0.0–4.4)
Cholesterol, Total: 209 mg/dL — ABNORMAL HIGH (ref 100–199)
HDL: 52 mg/dL (ref >39)
LDL Chol Calc (NIH): 132 mg/dL — ABNORMAL HIGH (ref 0–99)
Triglycerides: 143 mg/dL (ref 0–149)
VLDL Cholesterol Cal: 25 mg/dL (ref 5–40)

## 2019-12-19 LAB — CBC
Hematocrit: 44.4 % (ref 34.0–46.6)
Hemoglobin: 13.8 g/dL (ref 11.1–15.9)
MCH: 23.9 pg — ABNORMAL LOW (ref 26.6–33.0)
MCHC: 31.1 g/dL — ABNORMAL LOW (ref 31.5–35.7)
MCV: 77 fL — ABNORMAL LOW (ref 79–97)
Platelets: 240 10*3/uL (ref 150–450)
RBC: 5.77 x10E6/uL — ABNORMAL HIGH (ref 3.77–5.28)
RDW: 15.5 % — ABNORMAL HIGH (ref 11.7–15.4)
WBC: 5.6 10*3/uL (ref 3.4–10.8)

## 2019-12-19 LAB — VITAMIN D 25 HYDROXY (VIT D DEFICIENCY, FRACTURES): Vit D, 25-Hydroxy: 22.4 ng/mL — ABNORMAL LOW (ref 30.0–100.0)

## 2019-12-19 LAB — HEMOGLOBIN A1C
Est. average glucose Bld gHb Est-mCnc: 128 mg/dL
Hgb A1c MFr Bld: 6.1 % — ABNORMAL HIGH (ref 4.8–5.6)

## 2019-12-19 LAB — TSH: TSH: 1.28 u[IU]/mL (ref 0.450–4.500)

## 2019-12-19 MED ORDER — FERROUS SULFATE 325 (65 FE) MG PO TABS: 325.0000 mg | ORAL_TABLET | Freq: Two times a day (BID) | ORAL | 3 refills | Status: DC

## 2019-12-19 MED ORDER — ATORVASTATIN CALCIUM 20 MG PO TABS: 20.0000 mg | ORAL_TABLET | Freq: Every day | ORAL | 3 refills | Status: DC

## 2019-12-19 MED FILL — FERROUS SULFATE 325 MG TAB: 325 (65 FE) | 30 days supply | Qty: 60 | Fill #0

## 2019-12-19 MED FILL — ATORVASTATIN CALCIUM 20 MG: 20 | 30 days supply | Qty: 30 | Fill #0

## 2019-12-25 ENCOUNTER — Ambulatory Visit: Payer: Self-pay | Admitting: *Deleted

## 2019-12-25 NOTE — Telephone Encounter (Signed)
FYI

## 2019-12-25 NOTE — Telephone Encounter (Signed)
Returned call to patient  to review lab results of potassium levels. Lab results reviewed today and patient requesting specific values. C/o increasing muscle cramps in right leg at night. Reported outer right ankle swelling noted . Denies swelling in right foot or leg. Reports she will be starting on new medication today and requesting additional information or medication regarding muscle spasms at night. Care advise given to maintain hydration and call back if worsening of symptoms.  Patient verbalized understanding of care advise.   Reason for Disposition  [1] Caused by muscle cramps in the thigh, calf, or foot AND [2] present < 1 hour (brief, now gone)  Answer Assessment - Initial Assessment Questions 1. ONSET: "When did the pain start?"      Only at night 2. LOCATION: "Where is the pain located?"      Right leg 3. PAIN: "How bad is the pain?"    (Scale 1-10; or mild, moderate, severe)   -  MILD (1-3): doesn't interfere with normal activities    -  MODERATE (4-7): interferes with normal activities (e.g., work or school) or awakens from sleep, limping    -  SEVERE (8-10): excruciating pain, unable to do any normal activities, unable to walk     na 4. WORK OR EXERCISE: "Has there been any recent work or exercise that involved this part of the body?"      no 5. CAUSE: "What do you think is causing the leg pain?"     unknown 6. OTHER SYMPTOMS: "Do you have any other symptoms?" (e.g., chest pain, back pain, breathing difficulty, swelling, rash, fever, numbness, weakness)     No  Protocols used: LEG PAIN-A-AH

## 2019-12-27 NOTE — Telephone Encounter (Signed)
Noted. Agree with recommendations.  

## 2020-01-18 ENCOUNTER — Telehealth: Payer: Medicaid Other | Admitting: Nurse Practitioner

## 2020-01-22 ENCOUNTER — Encounter: Payer: Self-pay | Admitting: Pharmacist

## 2020-01-22 ENCOUNTER — Other Ambulatory Visit: Payer: Self-pay

## 2020-01-22 ENCOUNTER — Ambulatory Visit: Payer: Self-pay | Attending: Nurse Practitioner

## 2020-01-22 ENCOUNTER — Ambulatory Visit (HOSPITAL_BASED_OUTPATIENT_CLINIC_OR_DEPARTMENT_OTHER): Payer: Self-pay | Admitting: Pharmacist

## 2020-01-22 VITALS — BP 105/72

## 2020-01-22 DIAGNOSIS — I10 Essential (primary) hypertension: Secondary | ICD-10-CM

## 2020-01-22 MED FILL — LOSARTAN POTASSIUM 100 MG T: 100 | 30 days supply | Qty: 30 | Fill #1

## 2020-01-22 MED FILL — AMLODIPINE BESYLATE 10 MG T: 10 | 30 days supply | Qty: 30 | Fill #1

## 2020-01-22 NOTE — Progress Notes (Signed)
   S:    PCP: [name], NP   Patient arrives in good spirits. Presents to the clinic for hypertension evaluation, counseling, and management. Patient was referred and last seen by Primary Care Provider on 12/18/2019. At that visit, pt reported not taking any BP medications. Zelda restarted losartan and amlodipine.   Medication adherence reported with her amlodipine and losartan. Denies side effects. Denies headaches that were occurring prior to taking anti-hypertensive medications.   Current BP Medications include:  Amlodipine 10 mg daily, losartan 100 mg daily   Dietary habits include: compliant with salt restriction; denies excessive caffeine intake Exercise habits include: mainly at work; works as a Product manager a [employer] / Social history:  - FHx: HTN, asthma, aneurysm  - Tobacco: never smoker  - Alcohol: occasional use   O:  Vitals:   01/22/20 0904  BP: 105/72   Home BP readings: reports 110s/70s. No log - these are subjective.   Last 3 Office BP readings: BP Readings from Last 3 Encounters:  01/22/20 105/72  11/13/19 (!) 167/103  11/09/19 (!) 157/103    BMET    Component Value Date/Time   NA 144 12/18/2019 1518   K 4.2 12/18/2019 1518   CL 108 (H) 12/18/2019 1518   CO2 23 12/18/2019 1518   GLUCOSE 93 12/18/2019 1518   GLUCOSE 97 05/06/2014 1800   BUN 16 12/18/2019 1518   CREATININE 0.91 12/18/2019 1518   CREATININE 0.80 05/06/2014 1800   CALCIUM 9.9 12/18/2019 1518   GFRNONAA 68 12/18/2019 1518   GFRNONAA 83 05/06/2014 1800   GFRAA 79 12/18/2019 1518   GFRAA >89 05/06/2014 1800    Renal function: CrCl cannot be calculated (Patient's most recent lab result is older than the maximum 21 days allowed.).  Clinical ASCVD: No  The 10-year ASCVD risk score Mikey Bussing DC Jr., et al., 2013) is: 4.5%   Values used to calculate the score:     Age: 59 years     Sex: Female     Is Non-Hispanic African American: Yes     Diabetic: No     Tobacco smoker: No      Systolic Blood Pressure: 585 mmHg     Is BP treated: Yes     HDL Cholesterol: 52 mg/dL     Total Cholesterol: 209 mg/dL  A/P: Hypertension longstanding currently at goal on current medications. BP Goal = < 130/80 mmHg. Medication adherence reported.  -Continued current medication regimen.  -Counseled on lifestyle modifications for blood pressure control including reduced dietary sodium, increased exercise, adequate sleep.  Results reviewed and written information provided.   Total time in face-to-face counseling 30 minutes.   F/U Clinic Visit in 1 month with PCP.   Benard Halsted, PharmD, [place] [phone]

## 2020-01-29 MED FILL — FLUTICASONE PROP 50 MCG SPR: 50 | 30 days supply | Qty: 16 | Fill #1

## 2020-01-29 MED FILL — ATORVASTATIN CALCIUM 20 MG: 20 | 30 days supply | Qty: 30 | Fill #1

## 2020-01-29 MED FILL — FERROUS SULFATE 325 MG TAB: 325 (65 FE) | 30 days supply | Qty: 60 | Fill #1

## 2020-01-29 MED FILL — LOSARTAN POTASSIUM 100 MG T: 100 | 30 days supply | Qty: 30 | Fill #1

## 2020-01-29 MED FILL — AMLODIPINE BESYLATE 10 MG T: 10 | 30 days supply | Qty: 30 | Fill #1

## 2020-03-03 ENCOUNTER — Other Ambulatory Visit: Payer: Self-pay | Admitting: Nurse Practitioner

## 2020-03-03 ENCOUNTER — Other Ambulatory Visit: Payer: Self-pay

## 2020-03-03 ENCOUNTER — Encounter: Payer: Self-pay | Admitting: Nurse Practitioner

## 2020-03-03 ENCOUNTER — Ambulatory Visit: Payer: Self-pay | Attending: Nurse Practitioner | Admitting: Nurse Practitioner

## 2020-03-03 ENCOUNTER — Ambulatory Visit (HOSPITAL_COMMUNITY)
Admission: RE | Admit: 2020-03-03 | Discharge: 2020-03-03 | Disposition: A | Discharge status: Home / Self Care | Payer: Medicaid Other | Source: Ambulatory Visit | Attending: Nurse Practitioner | Admitting: Nurse Practitioner

## 2020-03-03 VITALS — BP 114/78 | HR 77 | Temp 97.7°F | Ht 62.99 in | Wt 174.0 lb

## 2020-03-03 DIAGNOSIS — R7303 Prediabetes: Secondary | ICD-10-CM

## 2020-03-03 DIAGNOSIS — Z6830 Body mass index (BMI) 30.0-30.9, adult: Secondary | ICD-10-CM | POA: Insufficient documentation

## 2020-03-03 DIAGNOSIS — M5441 Lumbago with sciatica, right side: Secondary | ICD-10-CM | POA: Insufficient documentation

## 2020-03-03 DIAGNOSIS — G8929 Other chronic pain: Secondary | ICD-10-CM | POA: Insufficient documentation

## 2020-03-03 DIAGNOSIS — Z23 Encounter for immunization: Secondary | ICD-10-CM

## 2020-03-03 DIAGNOSIS — M25471 Effusion, right ankle: Secondary | ICD-10-CM | POA: Insufficient documentation

## 2020-03-03 DIAGNOSIS — M7989 Other specified soft tissue disorders: Secondary | ICD-10-CM | POA: Insufficient documentation

## 2020-03-03 DIAGNOSIS — Z888 Allergy status to other drugs, medicaments and biological substances status: Secondary | ICD-10-CM | POA: Insufficient documentation

## 2020-03-03 DIAGNOSIS — I10 Essential (primary) hypertension: Secondary | ICD-10-CM

## 2020-03-03 DIAGNOSIS — F329 Major depressive disorder, single episode, unspecified: Secondary | ICD-10-CM | POA: Insufficient documentation

## 2020-03-03 DIAGNOSIS — M2550 Pain in unspecified joint: Secondary | ICD-10-CM

## 2020-03-03 DIAGNOSIS — M25472 Effusion, left ankle: Secondary | ICD-10-CM | POA: Insufficient documentation

## 2020-03-03 DIAGNOSIS — Z79899 Other long term (current) drug therapy: Secondary | ICD-10-CM | POA: Insufficient documentation

## 2020-03-03 DIAGNOSIS — G629 Polyneuropathy, unspecified: Secondary | ICD-10-CM

## 2020-03-03 DIAGNOSIS — Z803 Family history of malignant neoplasm of breast: Secondary | ICD-10-CM | POA: Insufficient documentation

## 2020-03-03 DIAGNOSIS — Z8249 Family history of ischemic heart disease and other diseases of the circulatory system: Secondary | ICD-10-CM | POA: Insufficient documentation

## 2020-03-03 LAB — GLUCOSE, POCT (MANUAL RESULT ENTRY): POC Glucose: 120 mg/dl — AB (ref 70–99)

## 2020-03-03 MED ORDER — DICLOFENAC SODIUM 1 % EX GEL: 2.0000 g | Freq: Four times a day (QID) | CUTANEOUS | 1 refills | Status: DC

## 2020-03-03 MED ORDER — AMLODIPINE BESYLATE 10 MG PO TABS: 10.0000 mg | ORAL_TABLET | Freq: Every day | ORAL | 1 refills | Status: DC

## 2020-03-03 MED ORDER — GABAPENTIN 300 MG PO CAPS: 300.0000 mg | ORAL_CAPSULE | Freq: Three times a day (TID) | ORAL | 3 refills | Status: DC

## 2020-03-03 MED ORDER — LOSARTAN POTASSIUM 100 MG PO TABS: 100.0000 mg | ORAL_TABLET | Freq: Every day | ORAL | 1 refills | Status: DC

## 2020-03-03 MED FILL — GABAPENTIN 300 MG CAPSULE: 300 | 30 days supply | Qty: 90 | Fill #0

## 2020-03-03 MED FILL — DICLOFENAC SODIUM 1% GEL: 1 | 12 days supply | Qty: 100 | Fill #0

## 2020-03-03 MED FILL — LOSARTAN POTASSIUM 100 MG T: 100 | 30 days supply | Qty: 30 | Fill #0

## 2020-03-03 MED FILL — AMLODIPINE BESYLATE 10 MG T: 10 | 30 days supply | Qty: 30 | Fill #0

## 2020-03-03 NOTE — Progress Notes (Signed)
Assessment & Plan:  Leslie Steele was seen today for swollen ankles.  Diagnoses and all orders for this visit:  Prediabetes -     Glucose (CBG)  Essential hypertension -     losartan (COZAAR) 100 MG tablet; Take 1 tablet (100 mg total) by mouth daily. -     amLODipine (NORVASC) 10 MG tablet; Take 1 tablet (10 mg total) by mouth daily. Continue all antihypertensives as prescribed.  Remember to bring in your blood pressure log with you for your follow up appointment.  DASH/Mediterranean Diets are healthier choices for HTN.    Chronic midline low back pain with right-sided sciatica -     gabapentin (NEURONTIN) 300 MG capsule; Take 1 capsule (300 mg total) by mouth 3 (three) times daily. -     DG Lumbar Spine Complete; Future Work on losing weight to help reduce back pain. May alternate with heat and ice application for pain relief. May also alternate with acetaminophen and Ibuprofen as prescribed for back pain. Other alternatives include massage, acupuncture and water aerobics.  You must stay active and avoid a sedentary lifestyle.   Need for immunization against influenza -     Flu Vaccine QUAD 6+ mos PF IM (Fluarix Quad PF)  Neuropathy -     gabapentin (NEURONTIN) 300 MG capsule; Take 1 capsule (300 mg total) by mouth 3 (three) times daily.  Arthralgia of multiple joints -     diclofenac Sodium (VOLTAREN) 1 % GEL; Apply 2 g topically 4 (four) times daily.    Patient has been counseled on age-appropriate routine health concerns for screening and prevention. These are reviewed and up-to-date. Referrals have been placed accordingly. Immunizations are up-to-date or declined.    Subjective:   Chief Complaint  Patient presents with   swollen ankles    Pt. stated both of her ankles are swollen for a year and a little numbness on right foot.    HPI Leslie Steele 59 y.o. female presents to office today with complaints of bilateral ankle swelling and right foot numbness. States the  ankle swelling and right foot numbness has been present for over a year.   has a past medical history of Depression and Hypertension.    Essential Hypertension Currently taking losartan 100 mg daily and amlodipine 10 mg daily. She has a history of ACE intolerance.  Denies chest pain, shortness of breath, palpitations, lightheadedness, dizziness, headaches or BLE edema.  BP Readings from Last 3 Encounters:  03/03/20 114/78  01/22/20 105/72  11/13/19 (!) 167/103   Sciatica Endorses low back pain with sciatica from right hip down to right foot. There is a cramping sensation that she experiences in her right leg which is worse at night. Denies any involuntary loss of urine or stool.  CT LUMBAR SPINE 02-2017 T12-L1:No significant central canal or neural foramen stenosis  L1-L2:No significant central canal or neural foramen stenosis  L2-L3: Mild posterior disc bulge. Mild if any narrowing central canal. Neural foramina patent.  L3-L4:No significant central canal or neural foramen stenosis  L4-L5: Diffuse posterior disc bulge and ligamentous hypertrophy. Mild narrowing central canal. Neural foramen patent  L5-S1: Posterior disc osteophyte with probable superimposed broad-based central protrusion. Mild narrowing central canal. Facet arthropathy. Mild narrowing bilateral neural foramen  IMPRESSION:  1. No acute fracture or dislocation  2. Degenerative change as detailed most most L5-S1    Review of Systems  Constitutional: Negative for fever, malaise/fatigue and weight loss.  HENT: Negative.  Negative for nosebleeds.  Eyes: Negative.  Negative for blurred vision, double vision and photophobia.  Respiratory: Negative.  Negative for cough and shortness of breath.   Cardiovascular: Negative.  Negative for chest pain, palpitations and leg swelling.  Gastrointestinal: Negative.  Negative for heartburn, nausea and vomiting.  Musculoskeletal: Positive for back pain, joint pain and myalgias.    Neurological: Positive for sensory change. Negative for dizziness, focal weakness, seizures and headaches.  Psychiatric/Behavioral: Positive for depression (well controlled with lexapro). Negative for suicidal ideas.    Past Medical History:  Diagnosis Date   Depression    Hypertension     Past Surgical History:  Procedure Laterality Date   ABDOMINAL HYSTERECTOMY     DG THUMB LEFT HAND     screws in hand    Family History  Problem Relation Age of Onset   Hypertension Mother    Breast cancer Mother    Asthma Mother    Hypertension Father    Aneurysm Father    Prostate cancer Father    Asthma Son     Social History Reviewed with no changes to be made today.   Outpatient Medications Prior to Visit  Medication Sig Dispense Refill   atorvastatin (LIPITOR) 20 MG tablet Take 1 tablet (20 mg total) by mouth daily. 90 tablet 3   ELDERBERRY PO Take 2 capsules by mouth daily.     ferrous sulfate 325 (65 FE) MG tablet Take 1 tablet (325 mg total) by mouth 2 (two) times daily with a meal. 120 tablet 3   fluticasone (FLONASE) 50 MCG/ACT nasal spray Place 2 sprays into both nostrils daily. 16 g 2   OVER THE COUNTER MEDICATION Take 8 oz by mouth daily. Beet juice     Turmeric (QC TUMERIC COMPLEX PO) Take 2 capsules by mouth daily.     amLODipine (NORVASC) 10 MG tablet Take 1 tablet (10 mg total) by mouth daily. 90 tablet 1   losartan (COZAAR) 100 MG tablet Take 1 tablet (100 mg total) by mouth daily. 90 tablet 0   acetaminophen (TYLENOL) 500 MG tablet Take 1,000 mg by mouth every 4 (four) hours as needed for pain (headache).     buPROPion (WELLBUTRIN XL) 150 MG 24 hr tablet Take 1 tablet (150 mg total) by mouth daily. 30 tablet 3   escitalopram (LEXAPRO) 10 MG tablet Take 1 tablet (10 mg total) by mouth daily. 30 tablet 3   No facility-administered medications prior to visit.    Allergies  Allergen Reactions   Lisinopril Cough       Objective:    BP  114/78 (BP Location: Left Arm, Patient Position: Sitting, Cuff Size: Normal)    Pulse 77    Temp 97.7 F (36.5 C) (Temporal)    Ht 5' 2.99" (1.6 m)    Wt 174 lb (78.9 kg)    SpO2 100%    BMI 30.83 kg/m  Wt Readings from Last 3 Encounters:  03/03/20 174 lb (78.9 kg)  12/18/19 171 lb (77.6 kg)  11/13/19 190 lb 0.6 oz (86.2 kg)    Physical Exam Vitals and nursing note reviewed.  Constitutional:      Appearance: She is well-developed.  HENT:     Head: Normocephalic and atraumatic.  Cardiovascular:     Rate and Rhythm: Normal rate and regular rhythm.     Pulses:          Dorsalis pedis pulses are 2+ on the right side and 2+ on the left side.  Posterior tibial pulses are 2+ on the right side and 2+ on the left side.     Heart sounds: Normal heart sounds. No murmur heard.  No friction rub. No gallop.   Pulmonary:     Effort: Pulmonary effort is normal. No tachypnea or respiratory distress.     Breath sounds: Normal breath sounds. No decreased breath sounds, wheezing, rhonchi or rales.  Chest:     Chest wall: No tenderness.  Abdominal:     General: Bowel sounds are normal.     Palpations: Abdomen is soft.  Musculoskeletal:        General: Normal range of motion.     Cervical back: Normal range of motion.       Feet:  Feet:     Right foot:     Protective Sensation: 10 sites tested. 10 sites sensed.     Skin integrity: No skin breakdown.     Left foot:     Protective Sensation: 10 sites tested. 10 sites sensed.     Skin integrity: No skin breakdown.     Comments: Fat atrophy bilateral lateral malleolar regions Skin:    General: Skin is warm and dry.  Neurological:     Mental Status: She is alert and oriented to person, place, and time.     Coordination: Coordination normal.  Psychiatric:        Behavior: Behavior normal. Behavior is cooperative.        Thought Content: Thought content normal.        Judgment: Judgment normal.          Patient has been counseled  extensively about nutrition and exercise as well as the importance of adherence with medications and regular follow-up. The patient was given clear instructions to go to ER or return to medical center if symptoms don't improve, worsen or new problems develop. The patient verbalized understanding.   Follow-up: Return for shingles vaccine appt with nurse. See me in 3 months.   Gildardo Pounds, FNP-BC [facility] and [place], [place]   03/03/2020, 2:02 PM

## 2020-03-09 ENCOUNTER — Other Ambulatory Visit: Payer: Self-pay | Admitting: Nurse Practitioner

## 2020-03-09 MED ORDER — METHOCARBAMOL 500 MG PO TABS: 500.0000 mg | ORAL_TABLET | Freq: Three times a day (TID) | ORAL | 0 refills | Status: DC | PRN

## 2020-03-09 MED ORDER — MELOXICAM 7.5 MG PO TABS: 7.5000 mg | ORAL_TABLET | Freq: Every day | ORAL | 1 refills | Status: DC

## 2020-03-10 MED FILL — METHOCARBAMOL 500 MG TABS: 500 | 30 days supply | Qty: 90 | Fill #0

## 2020-03-10 MED FILL — AMLODIPINE BESYLATE 10 MG T: 10 | 30 days supply | Qty: 30 | Fill #2

## 2020-03-10 MED FILL — GABAPENTIN 300 MG CAPSULE: 300 | 30 days supply | Qty: 90 | Fill #0

## 2020-03-10 MED FILL — DICLOFENAC SODIUM 1% GEL: 1 | 12 days supply | Qty: 100 | Fill #0

## 2020-03-10 MED FILL — LOSARTAN POTASSIUM 100 MG T: 100 | 30 days supply | Qty: 30 | Fill #2

## 2020-03-10 MED FILL — MELOXICAM 7.5 MG TABLET: 7.5 | 30 days supply | Qty: 30 | Fill #0

## 2020-03-11 ENCOUNTER — Ambulatory Visit: Payer: Self-pay | Attending: Nurse Practitioner | Admitting: Pharmacist

## 2020-03-11 ENCOUNTER — Other Ambulatory Visit: Payer: Self-pay

## 2020-03-11 DIAGNOSIS — Z23 Encounter for immunization: Secondary | ICD-10-CM

## 2020-03-11 MED FILL — FLUTICASONE PROP 50 MCG SPR: 50 | 30 days supply | Qty: 16 | Fill #2

## 2020-03-11 NOTE — Progress Notes (Signed)
Patient presents for vaccination against zoster per orders of Zelda. Consent given. Counseling provided. No contraindications exists. Vaccine administered without incident.   Luke Van Ausdall, PharmD, CPP Clinical Pharmacist [facility] [phone]  

## 2020-03-19 ENCOUNTER — Telehealth: Payer: Self-pay

## 2020-03-19 NOTE — Telephone Encounter (Signed)
Please accommodate pt for her 2 Shigrex shot ! Copied from [place] [phone]. Topic: Quick Communication - See Telephone Encounter >> Mar 18, 2020 10:34 AM Loma [place] wrote: CRM for notification. See Telephone encounter for: 03/18/20.pt had 1st Shingles shot on 10/5 and is wanting a CB to sch her 2nd shot    [phone]

## 2020-03-20 NOTE — Telephone Encounter (Signed)
Pt has an office visit with Zelda on 06/04/2020. We will take care of this on that day.

## 2020-05-06 ENCOUNTER — Other Ambulatory Visit: Payer: Self-pay | Admitting: Nurse Practitioner

## 2020-05-06 DIAGNOSIS — J309 Allergic rhinitis, unspecified: Secondary | ICD-10-CM

## 2020-05-06 MED FILL — AMLODIPINE BESYLATE 10 MG T: 10 | 30 days supply | Qty: 30 | Fill #3

## 2020-05-06 MED FILL — LOSARTAN POTASSIUM 100 MG T: 100 | 30 days supply | Qty: 30 | Fill #0

## 2020-05-06 MED FILL — FLUTICASONE PROP 50 MCG SPR: 50 | 30 days supply | Qty: 16 | Fill #0

## 2020-05-06 MED FILL — MELOXICAM 7.5 MG TABLET: 7.5 | 30 days supply | Qty: 30 | Fill #1

## 2020-06-04 ENCOUNTER — Ambulatory Visit: Payer: Medicaid Other | Admitting: Nurse Practitioner

## 2020-06-17 ENCOUNTER — Ambulatory Visit: Payer: Self-pay | Attending: Nurse Practitioner | Admitting: Nurse Practitioner

## 2020-06-17 ENCOUNTER — Other Ambulatory Visit: Payer: Self-pay

## 2020-06-17 ENCOUNTER — Encounter: Payer: Self-pay | Admitting: Nurse Practitioner

## 2020-06-17 ENCOUNTER — Other Ambulatory Visit: Payer: Self-pay | Admitting: Nurse Practitioner

## 2020-06-17 VITALS — BP 108/75 | HR 87 | Temp 98.8°F | Ht 62.99 in | Wt 177.0 lb

## 2020-06-17 DIAGNOSIS — E785 Hyperlipidemia, unspecified: Secondary | ICD-10-CM

## 2020-06-17 DIAGNOSIS — G629 Polyneuropathy, unspecified: Secondary | ICD-10-CM

## 2020-06-17 DIAGNOSIS — L0292 Furuncle, unspecified: Secondary | ICD-10-CM

## 2020-06-17 DIAGNOSIS — I10 Essential (primary) hypertension: Secondary | ICD-10-CM

## 2020-06-17 DIAGNOSIS — M5441 Lumbago with sciatica, right side: Secondary | ICD-10-CM

## 2020-06-17 DIAGNOSIS — Z1211 Encounter for screening for malignant neoplasm of colon: Secondary | ICD-10-CM

## 2020-06-17 DIAGNOSIS — D649 Anemia, unspecified: Secondary | ICD-10-CM

## 2020-06-17 DIAGNOSIS — G8929 Other chronic pain: Secondary | ICD-10-CM

## 2020-06-17 DIAGNOSIS — R7303 Prediabetes: Secondary | ICD-10-CM

## 2020-06-17 DIAGNOSIS — E559 Vitamin D deficiency, unspecified: Secondary | ICD-10-CM

## 2020-06-17 LAB — POCT GLYCOSYLATED HEMOGLOBIN (HGB A1C): HbA1c, POC (prediabetic range): 6.2 % (ref 5.7–6.4)

## 2020-06-17 LAB — GLUCOSE, POCT (MANUAL RESULT ENTRY): POC Glucose: 113 mg/dl — AB (ref 70–99)

## 2020-06-17 MED ORDER — AMLODIPINE BESYLATE 10 MG PO TABS: 10.0000 mg | ORAL_TABLET | Freq: Every day | ORAL | 1 refills | Status: DC

## 2020-06-17 MED ORDER — FERROUS SULFATE 325 (65 FE) MG PO TABS: 325.0000 mg | ORAL_TABLET | Freq: Two times a day (BID) | ORAL | 3 refills | Status: DC

## 2020-06-17 MED ORDER — DICLOFENAC SODIUM 1 % EX GEL
2.0000 g | Freq: Four times a day (QID) | CUTANEOUS | 1 refills | Status: DC
Start: 2020-06-17 — End: 2020-06-17

## 2020-06-17 MED ORDER — LOSARTAN POTASSIUM 100 MG PO TABS: 100.0000 mg | ORAL_TABLET | Freq: Every day | ORAL | 1 refills | Status: DC

## 2020-06-17 MED ORDER — MELOXICAM 7.5 MG PO TABS: 7.5000 mg | ORAL_TABLET | Freq: Every day | ORAL | 1 refills | Status: DC

## 2020-06-17 MED ORDER — GABAPENTIN 300 MG PO CAPS: 300.0000 mg | ORAL_CAPSULE | Freq: Three times a day (TID) | ORAL | 3 refills | Status: DC

## 2020-06-17 MED ORDER — HIBICLENS 4 % EX LIQD: Freq: Every day | CUTANEOUS | 3 refills | Status: DC | PRN

## 2020-06-17 MED ORDER — ATORVASTATIN CALCIUM 20 MG PO TABS: 20.0000 mg | ORAL_TABLET | Freq: Every day | ORAL | 3 refills | Status: DC

## 2020-06-17 MED FILL — FERROUS SULFATE 325 MG TAB: 325 (65 FE) | 30 days supply | Qty: 60 | Fill #0

## 2020-06-17 MED FILL — ?ATORVASTATIN 20 MG TABLET: 20 | 30 days supply | Qty: 30 | Fill #0

## 2020-06-17 MED FILL — LOSARTAN POTASSIUM 100 MG T: 100 | 30 days supply | Qty: 30 | Fill #0

## 2020-06-17 MED FILL — AMLODIPINE BESYLATE 10 MG T: 10 | 30 days supply | Qty: 30 | Fill #0

## 2020-06-17 MED FILL — GABAPENTIN 300 MG CAPSULE: 300 | 30 days supply | Qty: 90 | Fill #0

## 2020-06-17 MED FILL — MELOXICAM 7.5 MG TABLET: 7.5 | 30 days supply | Qty: 30 | Fill #0

## 2020-06-17 MED FILL — DICLOFENAC SODIUM 1% GEL: 1 | 12 days supply | Qty: 100 | Fill #0

## 2020-06-17 NOTE — Progress Notes (Signed)
Assessment & Plan:  Leslie Steele was seen today for follow-up.  Diagnoses and all orders for this visit:  Essential hypertension -     amLODipine (NORVASC) 10 MG tablet; Take 1 tablet (10 mg total) by mouth daily. -     losartan (COZAAR) 100 MG tablet; Take 1 tablet (100 mg total) by mouth daily. Continue all antihypertensives as prescribed.  Remember to bring in your blood pressure log with you for your follow up appointment.  DASH/Mediterranean Diets are healthier choices for HTN.   Prediabetes -     Glucose (CBG) -     HgB A1c -     CMP14+EGFR  Colon cancer screening -     Fecal occult blood, imunochemical([facility])  Chronic midline low back pain with right-sided sciatica -     meloxicam (MOBIC) 7.5 MG tablet; Take 1 tablet (7.5 mg total) by mouth daily. -     gabapentin (NEURONTIN) 300 MG capsule; Take 1 capsule (300 mg total) by mouth 3 (three) times daily. -     diclofenac Sodium (VOLTAREN) 1 % GEL; Apply 2 g topically 4 (four) times daily. Work on losing weight to help reduce back pain. May alternate with heat and ice application for pain relief. May also alternate with acetaminophen as prescribed for back pain. Other alternatives include massage, acupuncture and water aerobics.  You must stay active and avoid a sedentary lifestyle.   Neuropathy -     gabapentin (NEURONTIN) 300 MG capsule; Take 1 capsule (300 mg total) by mouth 3 (three) times daily.  Vitamin D deficiency disease -     VITAMIN D 25 Hydroxy (Vit-D Deficiency, Fractures)  Anemia, unspecified type -     ferrous sulfate 325 (65 FE) MG tablet; Take 1 tablet (325 mg total) by mouth 2 (two) times daily with a meal. -     CBC  Boil -     chlorhexidine (HIBICLENS) 4 % external liquid; Apply topically daily as needed.  Dyslipidemia -     atorvastatin (LIPITOR) 20 MG tablet; Take 1 tablet (20 mg total) by mouth daily. INSTRUCTIONS: Work on a low fat, heart healthy diet and participate in regular aerobic  exercise program by working out at least 150 minutes per week; 5 days a week-30 minutes per day. Avoid red meat/beef/steak,  fried foods. junk foods, sodas, sugary drinks, unhealthy snacking, alcohol and smoking.  Drink at least 80 oz of water per day and monitor your carbohydrate intake daily.    Patient has been counseled on age-appropriate routine health concerns for screening and prevention. These are reviewed and up-to-date. Referrals have been placed accordingly. Immunizations are up-to-date or declined.    Subjective:   Chief Complaint  Patient presents with  . Follow-up    Pt. Is here for 3 months follow up. Pt. Stated she have a boil on her left lower abdominal that hurts. Pt. Stated both of her ankles are swollen.    HPI Leslie Steele 60 y.o. female presents to office today for follow up.  has a past medical history of Depression and Hypertension.   She has complaints of chronic low back pain with right sided sciatica. 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 Currently taking meloxicam 7.5 mg which seems to provide relief of her pain. I also recommended a back brace at work and heating pad. She is not taking gabapentin as prescribed which I have also recommended.    Essential Hypertension Well controlled with losartan 100 mg daily  and amlodipine 10 mg daily.  BP Readings from Last 3 Encounters:  06/17/20 108/75  03/03/20 114/78  01/22/20 105/72    Anxiety and Depression She does not feel she needs to take wellbutrin or lexapro. She will let me know if and when she would like to resume it.  Depression screen [facility] 2/9 06/17/2020 03/03/2020 12/18/2019  Decreased Interest _0 Down, Depressed, Hopeless _1 PHQ - 2 Score _2 Altered sleeping _3 Tired, decreased energy _4 Change in appetite 1 0 2  Feeling bad or failure about yourself  1 0 1  Trouble concentrating 0 0 0  Moving slowly or fidgety/restless 0 0 0  Suicidal thoughts 0 0 0  PHQ-9 Score _5 GAD 7 : Generalized Anxiety Score 06/17/2020 12/18/2019  Nervous, Anxious, on Edge 1 2  Control/stop worrying 1 2  Worry too much - different things 1 2  Trouble relaxing 1 2  Restless 0 0  Easily annoyed or irritable 2 3  Afraid - awful might happen 1 0  Total GAD 7 Score 7 11    Prediabetes Well controlled with diet only at this time. LDL not at goal. Will refill atorvastatin 10 mg daily. May need to increase based on repeat lipid panel.  Lab Results  Component Value Date   HGBA1C 6.2 06/17/2020   Lab Results  Component Value Date   LDLCALC 132 (H) 12/18/2019   She has concerns of a "boil" on the lower left abdomen that feels tender on the inside. There is no tenderness with palpation however. She states she has had these boils in the past in other areas including the bilateral axilla. The areas also will drain and heal on their own. Based on exam today the area does not require any surgical intervention or antibiotics.     Review of Systems  Constitutional: Negative for fever, malaise/fatigue and weight loss.  HENT: Negative.  Negative for nosebleeds.   Eyes: Negative.  Negative for blurred vision, double vision and photophobia.  Respiratory: Negative.  Negative for cough and shortness of breath.   Cardiovascular: Negative.  Negative for chest pain, palpitations and leg swelling.  Gastrointestinal: Negative.  Negative for heartburn, nausea and vomiting.  Musculoskeletal: Positive for back pain. Negative for myalgias.  Skin:       SEE HPI  Neurological: Positive for tingling and sensory change. Negative for dizziness, focal weakness, seizures and headaches.  Psychiatric/Behavioral: Negative.  Negative for suicidal ideas.    Past Medical History:  Diagnosis Date  . Depression   . Hypertension     Past Surgical History:  Procedure Laterality Date  . ABDOMINAL HYSTERECTOMY    . DG THUMB LEFT HAND     screws in hand    Family History  Problem Relation Age of  Onset  . Hypertension Mother   . Breast cancer Mother   . Asthma Mother   . Hypertension Father   . Aneurysm Father   . Prostate cancer Father   . Asthma Son     Social History Reviewed with no changes to be made today.   Outpatient Medications Prior to Visit  Medication Sig Dispense Refill  . acetaminophen (TYLENOL) 500 MG tablet Take 1,000 mg by mouth every 4 (four) hours as needed for pain (headache).    . ELDERBERRY PO Take 2 capsules by mouth daily.    . fluticasone (FLONASE) 50 MCG/ACT nasal  spray PLACE 2 SPRAYS INTO BOTH NOSTRILS DAILY. 16 g 2  . OVER THE COUNTER MEDICATION Take 8 oz by mouth daily. Beet juice    . Turmeric (QC TUMERIC COMPLEX PO) Take 2 capsules by mouth daily.    Marland Kitchen atorvastatin (LIPITOR) 20 MG tablet Take 1 tablet (20 mg total) by mouth daily. 90 tablet 3  . diclofenac Sodium (VOLTAREN) 1 % GEL Apply 2 g topically 4 (four) times daily. 100 g 1  . ferrous sulfate 325 (65 FE) MG tablet Take 1 tablet (325 mg total) by mouth 2 (two) times daily with a meal. 120 tablet 3  . gabapentin (NEURONTIN) 300 MG capsule Take 1 capsule (300 mg total) by mouth 3 (three) times daily. 90 capsule 3  . losartan (COZAAR) 100 MG tablet Take 1 tablet (100 mg total) by mouth daily. 90 tablet 1  . meloxicam (MOBIC) 7.5 MG tablet Take 1 tablet (7.5 mg total) by mouth daily. 30 tablet 1  . amLODipine (NORVASC) 10 MG tablet Take 1 tablet (10 mg total) by mouth daily. 90 tablet 1  . buPROPion (WELLBUTRIN XL) 150 MG 24 hr tablet Take 1 tablet (150 mg total) by mouth daily. (Patient not taking: Reported on 06/17/2020) 30 tablet 3  . escitalopram (LEXAPRO) 10 MG tablet Take 1 tablet (10 mg total) by mouth daily. (Patient not taking: Reported on 06/17/2020) 30 tablet 3   No facility-administered medications prior to visit.    Allergies  Allergen Reactions  . Lisinopril Cough       Objective:    BP 108/75 (BP Location: Left Arm, Patient Position: Sitting, Cuff Size: Large)   Pulse 87    Temp 98.8 F (37.1 C) (Oral)   Ht 5' 2.99" (1.6 m)   Wt 177 lb (80.3 kg)   SpO2 98%   BMI 31.36 kg/m  Wt Readings from Last 3 Encounters:  06/17/20 177 lb (80.3 kg)  03/03/20 174 lb (78.9 kg)  12/18/19 171 lb (77.6 kg)    Physical Exam Vitals and nursing note reviewed.  Constitutional:      Appearance: She is well-developed and well-nourished.  HENT:     Head: Normocephalic and atraumatic.  Eyes:     Extraocular Movements: EOM normal.  Cardiovascular:     Rate and Rhythm: Normal rate and regular rhythm.     Pulses: Intact distal pulses.     Heart sounds: Normal heart sounds. No murmur heard. No friction rub. No gallop.   Pulmonary:     Effort: Pulmonary effort is normal. No tachypnea or respiratory distress.     Breath sounds: Normal breath sounds. No decreased breath sounds, wheezing, rhonchi or rales.  Chest:     Chest wall: No tenderness.  Abdominal:     General: Bowel sounds are normal.     Palpations: Abdomen is soft.  Musculoskeletal:        General: No swelling, tenderness, deformity, signs of injury or edema. Normal range of motion.     Cervical back: Normal range of motion.     Right lower leg: No edema.     Left lower leg: No edema.  Skin:    General: Skin is warm and dry.     Findings: Lesion (see photo) present. No erythema or rash.  Neurological:     Mental Status: She is alert and oriented to person, place, and time.     Coordination: Coordination normal.  Psychiatric:        Mood and Affect: Mood and  affect normal.        Behavior: Behavior normal. Behavior is cooperative.        Thought Content: Thought content normal.        Judgment: Judgment normal.          Patient has been counseled extensively about nutrition and exercise as well as the importance of adherence with medications and regular follow-up. The patient was given clear instructions to go to ER or return to medical center if symptoms don't improve, worsen or new problems develop.  The patient verbalized understanding.   Follow-up: Return for follow up with me in 3 months. Schedule with luke for shingrix in 2 months .   Gildardo Pounds, FNP-BC [facility] and [place], [place]   06/17/2020, 11:00 PM

## 2020-06-18 LAB — CMP14+EGFR
ALT: 17 IU/L (ref 0–32)
AST: 16 IU/L (ref 0–40)
Albumin/Globulin Ratio: 1.5 (ref 1.2–2.2)
Albumin: 4.2 g/dL (ref 3.8–4.8)
Alkaline Phosphatase: 97 IU/L (ref 44–121)
BUN/Creatinine Ratio: 13 (ref 12–28)
BUN: 11 mg/dL (ref 8–27)
Bilirubin Total: 0.5 mg/dL (ref 0.0–1.2)
CO2: 24 mmol/L (ref 20–29)
Calcium: 10 mg/dL (ref 8.7–10.3)
Chloride: 106 mmol/L (ref 96–106)
Creatinine, Ser: 0.84 mg/dL (ref 0.57–1.00)
GFR calc Af Amer: 87 mL/min/{1.73_m2} (ref >59)
GFR calc non Af Amer: 75 mL/min/{1.73_m2} (ref >59)
Globulin, Total: 2.8 g/dL (ref 1.5–4.5)
Glucose: 93 mg/dL (ref 65–99)
Potassium: 3.9 mmol/L (ref 3.5–5.2)
Sodium: 144 mmol/L (ref 134–144)
Total Protein: 7 g/dL (ref 6.0–8.5)

## 2020-06-18 LAB — CBC
Hematocrit: 42.3 % (ref 34.0–46.6)
Hemoglobin: 13.4 g/dL (ref 11.1–15.9)
MCH: 24.1 pg — ABNORMAL LOW (ref 26.6–33.0)
MCHC: 31.7 g/dL (ref 31.5–35.7)
MCV: 76 fL — ABNORMAL LOW (ref 79–97)
Platelets: 246 10*3/uL (ref 150–450)
RBC: 5.57 x10E6/uL — ABNORMAL HIGH (ref 3.77–5.28)
RDW: 14.4 % (ref 11.7–15.4)
WBC: 5.1 10*3/uL (ref 3.4–10.8)

## 2020-06-18 LAB — VITAMIN D 25 HYDROXY (VIT D DEFICIENCY, FRACTURES): Vit D, 25-Hydroxy: 22.7 ng/mL — ABNORMAL LOW (ref 30.0–100.0)

## 2020-06-20 ENCOUNTER — Encounter: Payer: Self-pay | Admitting: Nurse Practitioner

## 2020-06-20 ENCOUNTER — Other Ambulatory Visit: Payer: Self-pay | Admitting: Nurse Practitioner

## 2020-06-20 MED ORDER — CITALOPRAM HYDROBROMIDE 20 MG PO TABS: 20.0000 mg | ORAL_TABLET | Freq: Every day | ORAL | 3 refills | Status: DC

## 2020-06-23 MED FILL — CITALOPRAM HBR 20 MG TABLET: 20 | 30 days supply | Qty: 30 | Fill #0

## 2020-06-30 MED FILL — AMLODIPINE BESYLATE 10 MG T: 10 | 30 days supply | Qty: 30 | Fill #0

## 2020-06-30 MED FILL — DICLOFENAC SODIUM 1% GEL: 1 | 12 days supply | Qty: 100 | Fill #0

## 2020-06-30 MED FILL — FLUTICASONE PROP 50 MCG SPR: 50 | 30 days supply | Qty: 16 | Fill #1

## 2020-06-30 MED FILL — LOSARTAN POTASSIUM 100 MG T: 100 | 30 days supply | Qty: 30 | Fill #0

## 2020-06-30 MED FILL — CITALOPRAM HBR 20 MG TABLET: 20 | 30 days supply | Qty: 30 | Fill #0

## 2020-07-30 ENCOUNTER — Encounter: Payer: Self-pay | Admitting: Nurse Practitioner

## 2020-08-04 MED FILL — CITALOPRAM HBR 20 MG TABLET: 20 | 30 days supply | Qty: 30 | Fill #1

## 2020-08-04 MED FILL — FLUTICASONE PROP 50 MCG SPR: 50 | 30 days supply | Qty: 16 | Fill #2

## 2020-08-04 MED FILL — AMLODIPINE BESYLATE 10 MG T: 10 | 30 days supply | Qty: 30 | Fill #1

## 2020-08-04 MED FILL — ?ATORVASTATIN 20 MG TABLET: 20 | 30 days supply | Qty: 30 | Fill #0

## 2020-08-04 MED FILL — LOSARTAN POTASSIUM 100 MG T: 100 | 30 days supply | Qty: 30 | Fill #1

## 2020-08-08 LAB — FECAL OCCULT BLOOD, IMMUNOCHEMICAL: Fecal Occult Bld: NEGATIVE

## 2020-08-21 ENCOUNTER — Other Ambulatory Visit: Payer: Self-pay | Admitting: Nurse Practitioner

## 2020-08-25 ENCOUNTER — Ambulatory Visit: Payer: Medicaid Other | Admitting: Pharmacist

## 2020-09-02 MED FILL — MELOXICAM 7.5 MG TABLET: 7.5 | 30 days supply | Qty: 30 | Fill #0

## 2020-09-03 ENCOUNTER — Ambulatory Visit: Payer: Medicaid Other | Attending: Nurse Practitioner | Admitting: Pharmacist

## 2020-09-03 ENCOUNTER — Other Ambulatory Visit: Payer: Self-pay

## 2020-09-03 DIAGNOSIS — Z23 Encounter for immunization: Secondary | ICD-10-CM

## 2020-09-03 MED FILL — GABAPENTIN 300 MG CAPSULE: 300 | 30 days supply | Qty: 90 | Fill #0

## 2020-09-03 MED FILL — LOSARTAN POTASSIUM 100 MG T: 100 | 30 days supply | Qty: 30 | Fill #2

## 2020-09-03 MED FILL — AMLODIPINE BESYLATE 10 MG T: 10 | 30 days supply | Qty: 30 | Fill #2

## 2020-09-03 NOTE — Progress Notes (Signed)
Patient presents for vaccination against zoster per orders of Zelda. Consent given. Counseling provided. No contraindications exists. Vaccine administered without incident.   Benard Halsted, PharmD, [place], [place] [phone]

## 2020-09-06 ENCOUNTER — Other Ambulatory Visit: Payer: Self-pay

## 2020-09-17 ENCOUNTER — Other Ambulatory Visit: Payer: Self-pay

## 2020-09-17 MED FILL — Diclofenac Sodium Gel 1% (1.16% Diethylamine Equiv): CUTANEOUS | 25 days supply | Qty: 100 | Fill #0 | Status: AC

## 2020-09-18 ENCOUNTER — Other Ambulatory Visit: Payer: Self-pay

## 2020-09-23 ENCOUNTER — Other Ambulatory Visit: Payer: Self-pay

## 2020-11-14 ENCOUNTER — Other Ambulatory Visit: Payer: Self-pay

## 2020-11-14 ENCOUNTER — Other Ambulatory Visit: Payer: Self-pay | Admitting: Nurse Practitioner

## 2020-11-14 DIAGNOSIS — J309 Allergic rhinitis, unspecified: Secondary | ICD-10-CM

## 2020-11-14 DIAGNOSIS — M5441 Lumbago with sciatica, right side: Secondary | ICD-10-CM

## 2020-11-14 MED ORDER — FLUTICASONE PROPIONATE 50 MCG/ACT NA SUSP
2.0000 | Freq: Every day | NASAL | 0 refills | Status: DC
  Filled 2020-11-14: qty 16, 30d supply, fill #0

## 2020-11-14 MED ORDER — DICLOFENAC SODIUM 1 % EX GEL
2.0000 g | Freq: Four times a day (QID) | CUTANEOUS | 0 refills | Status: DC
  Filled 2020-11-14: qty 100, 12d supply, fill #0

## 2020-11-14 MED FILL — Amlodipine Besylate Tab 10 MG (Base Equivalent): ORAL | 30 days supply | Qty: 30 | Fill #0 | Status: AC

## 2020-11-14 MED FILL — Losartan Potassium Tab 100 MG: ORAL | 30 days supply | Qty: 30 | Fill #0 | Status: AC

## 2020-11-14 NOTE — Telephone Encounter (Signed)
  Notes to clinic: Review for refill Patient no showed last appt    Requested Prescriptions  Pending Prescriptions Disp Refills   diclofenac Sodium (VOLTAREN) 1 % GEL 100 g 1    Sig: APPLY 2 G TOPICALLY 4 (FOUR) TIMES DAILY.      Analgesics:  Topicals Passed - 11/14/2020  1:51 PM      Passed - Valid encounter within last 12 months    Recent Outpatient Visits           2 months ago Need for zoster vaccination   [place], RPH-CPP   5 months ago Essential hypertension   [place] Gildardo Pounds, NP   8 months ago Need for zoster vaccination   [place], RPH-CPP   8 months ago Essential hypertension   [place], Vernia Buff, NP   9 months ago Essential hypertension   [place], Annie Main L, RPH-CPP                  fluticasone (FLONASE) 50 MCG/ACT nasal spray 16 g 2    Sig: PLACE 2 SPRAYS INTO BOTH NOSTRILS DAILY.      Ear, Nose, and Throat: Nasal Preparations - Corticosteroids Passed - 11/14/2020  1:51 PM      Passed - Valid encounter within last 12 months    Recent Outpatient Visits           2 months ago Need for zoster vaccination   [place], RPH-CPP   5 months ago Essential hypertension   [place], Zelda W, NP   8 months ago Need for zoster vaccination   [place], Jarome Matin, RPH-CPP   8 months ago Essential hypertension   [place], Vernia Buff, NP   9 months ago Essential hypertension   [place], RPH-CPP

## 2020-11-17 ENCOUNTER — Other Ambulatory Visit: Payer: Self-pay

## 2021-01-13 ENCOUNTER — Other Ambulatory Visit: Payer: Self-pay

## 2021-01-13 MED FILL — Losartan Potassium Tab 100 MG: ORAL | 30 days supply | Qty: 30 | Fill #1 | Status: CN

## 2021-01-13 MED FILL — Amlodipine Besylate Tab 10 MG (Base Equivalent): ORAL | 30 days supply | Qty: 30 | Fill #1 | Status: AC

## 2021-01-16 ENCOUNTER — Other Ambulatory Visit: Payer: Self-pay

## 2021-01-16 ENCOUNTER — Other Ambulatory Visit: Payer: Self-pay | Admitting: Nurse Practitioner

## 2021-01-16 ENCOUNTER — Other Ambulatory Visit: Payer: Self-pay | Admitting: Family Medicine

## 2021-01-16 ENCOUNTER — Telehealth: Payer: Self-pay | Admitting: Nurse Practitioner

## 2021-01-16 DIAGNOSIS — G8929 Other chronic pain: Secondary | ICD-10-CM

## 2021-01-16 MED ORDER — DICLOFENAC SODIUM 1 % EX GEL
2.0000 g | Freq: Four times a day (QID) | CUTANEOUS | 0 refills | Status: DC
  Filled 2021-01-16: qty 100, 12d supply, fill #0

## 2021-01-16 NOTE — Telephone Encounter (Signed)
  Notes to clinic:   meloxicam (MOBIC) 7.5 MG tablet        The original prescription was discontinued on 09/06/2020 by [place] for the following reason: [employer]. Renewing this prescription may not be appropriate     Requested Prescriptions  Pending Prescriptions Disp Refills   meloxicam (MOBIC) 7.5 MG tablet 30 tablet 1    Sig: TAKE 1 TABLET (7.5 MG TOTAL) BY MOUTH DAILY.     Analgesics:  COX2 Inhibitors Passed - 01/16/2021  2:47 PM      Passed - HGB in normal range and within 360 days    Hemoglobin  Date Value Ref Range Status  06/17/2020 13.4 11.1 - 15.9 g/dL Final          Passed - Cr in normal range and within 360 days    Creat  Date Value Ref Range Status  05/06/2014 0.80 0.50 - 1.10 mg/dL Final   Creatinine, Ser  Date Value Ref Range Status  06/17/2020 0.84 0.57 - 1.00 mg/dL Final          Passed - Patient is not pregnant      Passed - Valid encounter within last 12 months    Recent Outpatient Visits           4 months ago Need for zoster vaccination   [place], Jarome Matin, RPH-CPP   7 months ago Essential hypertension   [place], Vernia Buff, NP   10 months ago Need for zoster vaccination   [place], Jarome Matin, RPH-CPP   10 months ago Essential hypertension   [place], Vernia Buff, NP   12 months ago Essential hypertension   [place], [place], RPH-CPP       Future Appointments             In 1 month Gildardo Pounds, NP [place]

## 2021-01-16 NOTE — Telephone Encounter (Signed)
Requests have already been sent for approval

## 2021-01-16 NOTE — Telephone Encounter (Signed)
Medication Refill - Medication: meloxicam 7.5 and voltaren gel  Has the patient contacted their pharmacy? Yes pt called back and has an appt with zelda on 03-06-2021  Preferred Pharmacy (with phone number or street name): [facility] [phone]  Agent: Please be advised that RX refills may take up to 3 business days. We ask that you follow-up with your pharmacy.

## 2021-01-16 NOTE — Telephone Encounter (Signed)
Requested medication (s) are due for refill today: yes   Requested medication (s) are on the active medication list:  yes   Last refill:  11/17/2020  Future visit scheduled:  no  Notes to clinic:  message sent to patient to contact office for appt    Requested Prescriptions  Pending Prescriptions Disp Refills   diclofenac Sodium (VOLTAREN) 1 % GEL 100 g 0    Sig: Apply 2 g topically 4 (four) times daily.     Analgesics:  Topicals Passed - 01/16/2021  2:47 PM      Passed - Valid encounter within last 12 months    Recent Outpatient Visits           4 months ago Need for zoster vaccination   [place], RPH-CPP   7 months ago Essential hypertension   [place], Zelda W, NP   10 months ago Need for zoster vaccination   [place], RPH-CPP   10 months ago Essential hypertension   [place], Vernia Buff, NP   12 months ago Essential hypertension   [place], RPH-CPP

## 2021-01-19 ENCOUNTER — Other Ambulatory Visit: Payer: Self-pay

## 2021-01-19 MED FILL — Losartan Potassium Tab 100 MG: ORAL | 30 days supply | Qty: 30 | Fill #1 | Status: AC

## 2021-01-21 ENCOUNTER — Other Ambulatory Visit: Payer: Self-pay

## 2021-02-01 ENCOUNTER — Encounter: Payer: Self-pay | Admitting: Nurse Practitioner

## 2021-02-02 ENCOUNTER — Other Ambulatory Visit: Payer: Self-pay | Admitting: Nurse Practitioner

## 2021-02-02 ENCOUNTER — Other Ambulatory Visit: Payer: Self-pay

## 2021-02-02 DIAGNOSIS — M5441 Lumbago with sciatica, right side: Secondary | ICD-10-CM

## 2021-02-02 DIAGNOSIS — G8929 Other chronic pain: Secondary | ICD-10-CM

## 2021-02-02 DIAGNOSIS — G629 Polyneuropathy, unspecified: Secondary | ICD-10-CM

## 2021-02-02 MED ORDER — METHOCARBAMOL 500 MG PO TABS
ORAL_TABLET | Freq: Three times a day (TID) | ORAL | 0 refills | Status: DC | PRN
  Filled 2021-02-02: qty 90, 30d supply, fill #0

## 2021-02-02 MED ORDER — GABAPENTIN 300 MG PO CAPS
ORAL_CAPSULE | Freq: Three times a day (TID) | ORAL | 3 refills | Status: DC
  Filled 2021-02-02: qty 90, 30d supply, fill #0

## 2021-02-02 NOTE — Telephone Encounter (Signed)
Medication Refill - Medication: gabapentin (NEURONTIN) 300 MG capsule  Patient also requesting meloxicam . She is out of the medication Has the patient contacted their pharmacy? yes (Agent: If no, request that the patient contact the pharmacy for the refill.) (Agent: If yes, when and what did the pharmacy advise?)contact pcp  Preferred Pharmacy (with phone number or street name):  [facility] Phone:  [phone]  Fax:  [phone]      Agent: Please be advised that RX refills may take up to 3 business days. We ask that you follow-up with your pharmacy.

## 2021-02-03 NOTE — Telephone Encounter (Signed)
Requested medication (s) are due for refill today: Yes  Requested medication (s) are on the active medication list: No  Last refill:  03/2020  Future visit scheduled: Yes  Notes to clinic:  Unable to refill per protocol, no longer on profile, d/c by interface, patient requesting refill    Requested Prescriptions  Pending Prescriptions Disp Refills   meloxicam (MOBIC) 7.5 MG tablet 30 tablet 1    Sig: TAKE 1 TABLET (7.5 MG TOTAL) BY MOUTH DAILY.     There is no refill protocol information for this order

## 2021-02-06 MED ORDER — MELOXICAM 7.5 MG PO TABS
ORAL_TABLET | Freq: Every day | ORAL | 1 refills | Status: DC
  Filled 2021-02-06: qty 30, 30d supply, fill #0
  Filled 2021-03-26: qty 30, 30d supply, fill #1

## 2021-02-07 ENCOUNTER — Other Ambulatory Visit: Payer: Self-pay

## 2021-02-10 ENCOUNTER — Other Ambulatory Visit: Payer: Self-pay

## 2021-02-11 ENCOUNTER — Other Ambulatory Visit: Payer: Self-pay

## 2021-02-13 ENCOUNTER — Other Ambulatory Visit: Payer: Self-pay

## 2021-02-19 ENCOUNTER — Other Ambulatory Visit: Payer: Self-pay

## 2021-02-19 MED FILL — Amlodipine Besylate Tab 10 MG (Base Equivalent): ORAL | 30 days supply | Qty: 30 | Fill #2 | Status: AC

## 2021-02-20 ENCOUNTER — Other Ambulatory Visit: Payer: Self-pay

## 2021-03-04 ENCOUNTER — Other Ambulatory Visit: Payer: Self-pay | Admitting: Family Medicine

## 2021-03-04 ENCOUNTER — Other Ambulatory Visit: Payer: Self-pay

## 2021-03-04 DIAGNOSIS — G8929 Other chronic pain: Secondary | ICD-10-CM

## 2021-03-04 NOTE — Telephone Encounter (Signed)
Requested medication (s) are due for refill today- no  Requested medication (s) are on the active medication list -yes  Future visit scheduled -yes  Last refill: 01/19/21  Notes to clinic: Request RF: last RF has notes-must have visit for refills- sent for review   Requested Prescriptions  Pending Prescriptions Disp Refills   diclofenac Sodium (VOLTAREN) 1 % GEL 100 g 0    Sig: Apply 2 g topically 4 (four) times daily.     Analgesics:  Topicals Passed - 03/04/2021  1:33 PM      Passed - Valid encounter within last 12 months    Recent Outpatient Visits           6 months ago Need for zoster vaccination   [place], RPH-CPP   8 months ago Essential hypertension   [place] Gildardo Pounds, NP   11 months ago Need for zoster vaccination   [place], Jarome Matin, RPH-CPP   1 year ago Essential hypertension   [place], Zelda W, NP   1 year ago Essential hypertension   [place], RPH-CPP       Future Appointments             In 2 days Gildardo Pounds, NP [place]               Requested Prescriptions  Pending Prescriptions Disp Refills   diclofenac Sodium (VOLTAREN) 1 % GEL 100 g 0    Sig: Apply 2 g topically 4 (four) times daily.     Analgesics:  Topicals Passed - 03/04/2021  1:33 PM      Passed - Valid encounter within last 12 months    Recent Outpatient Visits           6 months ago Need for zoster vaccination   [place], RPH-CPP   8 months ago Essential hypertension   [place], Zelda W, NP   11 months ago Need for zoster vaccination   [place], RPH-CPP    1 year ago Essential hypertension   [place], Vernia Buff, NP   1 year ago Essential hypertension   [place], RPH-CPP       Future Appointments             In 2 days Gildardo Pounds, NP [place]

## 2021-03-06 ENCOUNTER — Other Ambulatory Visit: Payer: Self-pay

## 2021-03-06 ENCOUNTER — Ambulatory Visit: Payer: Self-pay | Attending: Nurse Practitioner | Admitting: Nurse Practitioner

## 2021-03-06 ENCOUNTER — Encounter: Payer: Self-pay | Admitting: Nurse Practitioner

## 2021-03-06 VITALS — BP 132/87 | HR 66 | Resp 16 | Wt 183.4 lb

## 2021-03-06 DIAGNOSIS — Z1231 Encounter for screening mammogram for malignant neoplasm of breast: Secondary | ICD-10-CM

## 2021-03-06 DIAGNOSIS — I10 Essential (primary) hypertension: Secondary | ICD-10-CM

## 2021-03-06 DIAGNOSIS — E785 Hyperlipidemia, unspecified: Secondary | ICD-10-CM

## 2021-03-06 DIAGNOSIS — G8929 Other chronic pain: Secondary | ICD-10-CM

## 2021-03-06 DIAGNOSIS — D649 Anemia, unspecified: Secondary | ICD-10-CM

## 2021-03-06 DIAGNOSIS — R7303 Prediabetes: Secondary | ICD-10-CM

## 2021-03-06 DIAGNOSIS — M5441 Lumbago with sciatica, right side: Secondary | ICD-10-CM

## 2021-03-06 MED ORDER — DICLOFENAC SODIUM 1 % EX GEL
2.0000 g | Freq: Four times a day (QID) | CUTANEOUS | 3 refills | Status: AC
Start: 2021-03-06 — End: 2022-03-06
  Filled 2021-03-06 – 2021-08-28 (×3): qty 200, 24d supply, fill #0
  Filled 2021-11-25: qty 200, 24d supply, fill #1
  Filled 2022-01-05: qty 100, 24d supply, fill #2
  Filled 2022-01-11: qty 200, 25d supply, fill #2

## 2021-03-06 MED ORDER — LOSARTAN POTASSIUM 100 MG PO TABS
ORAL_TABLET | Freq: Every day | ORAL | 1 refills | Status: AC
  Filled 2021-03-06 – 2021-06-29 (×3): qty 30, 30d supply, fill #0
  Filled 2021-06-29 – 2021-09-04 (×2): qty 30, 30d supply, fill #1
  Filled 2021-10-02 – 2021-10-15 (×2): qty 30, 30d supply, fill #2
  Filled 2021-11-25: qty 30, 30d supply, fill #3
  Filled 2022-01-05 – 2022-01-11 (×2): qty 30, 30d supply, fill #4

## 2021-03-06 MED ORDER — DULOXETINE HCL 30 MG PO CPEP
30.0000 mg | ORAL_CAPSULE | Freq: Every day | ORAL | 3 refills | Status: DC
  Filled 2021-03-06: qty 30, 30d supply, fill #0
  Filled 2021-04-08 – 2021-04-16 (×2): qty 30, 30d supply, fill #1
  Filled 2021-05-19: qty 30, 30d supply, fill #2
  Filled 2021-06-23: qty 30, 30d supply, fill #3
  Filled 2021-06-24: qty 30, 30d supply, fill #0

## 2021-03-06 MED ORDER — AMLODIPINE BESYLATE 10 MG PO TABS
ORAL_TABLET | Freq: Every day | ORAL | 1 refills | Status: DC
Start: 2021-03-06 — End: 2022-01-05
  Filled 2021-03-06: qty 90, fill #0
  Filled 2021-03-26: qty 30, 30d supply, fill #0
  Filled 2021-05-07: qty 30, 30d supply, fill #1
  Filled 2021-06-29: qty 30, 30d supply, fill #0
  Filled 2021-06-29: qty 30, 30d supply, fill #2
  Filled 2021-09-04: qty 30, 30d supply, fill #1
  Filled 2021-10-02 – 2021-10-15 (×3): qty 30, 30d supply, fill #2
  Filled 2021-11-25: qty 30, 30d supply, fill #3

## 2021-03-06 NOTE — Progress Notes (Signed)
Assessment & Plan:  Leslie Steele was seen today for back pain and medication refill.  Diagnoses and all orders for this visit:  Essential hypertension -     amLODipine (NORVASC) 10 MG tablet; TAKE 1 TABLET (10 MG TOTAL) BY MOUTH DAILY. -     losartan (COZAAR) 100 MG tablet; TAKE 1 TABLET (100 MG TOTAL) BY MOUTH DAILY. -     CMP14+EGFR Continue all antihypertensives as prescribed.  Remember to bring in your blood pressure log with you for your follow up appointment.  DASH/Mediterranean Diets are healthier choices for HTN.    Breast cancer screening by mammogram -     MS DIGITAL SCREENING TOMO BILATERAL; Future  Chronic midline low back pain with right-sided sciatica -     diclofenac Sodium (VOLTAREN) 1 % GEL; Apply 2 g topically 4 (four) times daily. -     DULoxetine (CYMBALTA) 30 MG capsule; Take 1 capsule (30 mg total) by mouth daily. -     Ambulatory referral to Physical Therapy -     CT Lumbar Spine W Contrast; Future  Dyslipidemia -     Lipid panel INSTRUCTIONS: Work on a low fat, heart healthy diet and participate in regular aerobic exercise program by working out at least 150 minutes per week; 5 days a week-30 minutes per day. Avoid red meat/beef/steak,  fried foods. junk foods, sodas, sugary drinks, unhealthy snacking, alcohol and smoking.  Drink at least 80 oz of water per day and monitor your carbohydrate intake daily.    Prediabetes -     Hemoglobin A1c Continue blood sugar control as discussed in office today, low carbohydrate diet, and regular physical exercise as tolerated, 150 minutes per week (30 min each day, 5 days per week, or 50 min 3 days per week).   Anemia, unspecified type -     CBC   Patient has been counseled on age-appropriate routine health concerns for screening and prevention. These are reviewed and up-to-date. Referrals have been placed accordingly. Immunizations are up-to-date or declined.    Subjective:   Chief Complaint  Patient presents with    Back Pain   Medication Refill   HPI Leslie Steele 60 y.o. female presents to office today with complaints of chronic back pain. She has a past medical history of Depression and Hypertension.     Onset several years ago. She has had multiple back injuries in the past with the most recent in August 2018 being up a cooler in August 2018. Pain is constant and aggravated with prolonged sitting, standing, bending and twisting.  Denies any symptoms of cauda equina.  She does endorse some improvement of back pain with taking methocarbamol and meloxicam. CT lumbar spine Degenerative change as detailed most most L5-S1   L4-L5: Diffuse posterior disc bulge and ligamentous hypertrophy. Mild narrowing central canal. Neural foramen patent L5-S1: Posterior disc osteophyte with probable superimposed broad-based central protrusion. Mild narrowing central canal. Facet arthropathy. Mild narrowing bilateral neural foramen  HTN Blood pressures controlled with losartan 100 mg daily and amlodipine 10 mg daily. Denies chest pain, shortness of breath, palpitations, lightheadedness, dizziness, headaches or BLE edema.   BP Readings from Last 3 Encounters:  03/06/21 132/87  06/17/20 108/75  03/03/20 114/78     Prediabetes Well controlled. She is not taking any medications for diabetes. LDL not at goal with amlodipine 10 mg daily.  Lab Results  Component Value Date   HGBA1C 6.2 06/17/2020    Lab Results  Component Value Date  [place] 132 (H) 12/18/2019    Review of Systems  Constitutional:  Negative for fever, malaise/fatigue and weight loss.  HENT: Negative.  Negative for nosebleeds.   Eyes: Negative.  Negative for blurred vision, double vision and photophobia.  Respiratory: Negative.  Negative for cough and shortness of breath.   Cardiovascular: Negative.  Negative for chest pain, palpitations and leg swelling.  Gastrointestinal: Negative.  Negative for heartburn, nausea and vomiting.  Musculoskeletal:   Positive for back pain and myalgias.  Neurological: Negative.  Negative for dizziness, focal weakness, seizures and headaches.  Psychiatric/Behavioral: Negative.  Negative for suicidal ideas.    Past Medical History:  Diagnosis Date   Depression    Hypertension     Past Surgical History:  Procedure Laterality Date   ABDOMINAL HYSTERECTOMY     DG THUMB LEFT HAND     screws in hand    Family History  Problem Relation Age of Onset   Hypertension Mother    Breast cancer Mother    Asthma Mother    Hypertension Father    Aneurysm Father    Prostate cancer Father    Asthma Son     Social History Reviewed with no changes to be made today.   Outpatient Medications Prior to Visit  Medication Sig Dispense Refill   acetaminophen (TYLENOL) 500 MG tablet Take 1,000 mg by mouth every 4 (four) hours as needed for pain (headache).     ELDERBERRY PO Take 2 capsules by mouth daily.     ferrous sulfate 325 (65 FE) MG tablet TAKE 1 TABLET (325 MG TOTAL) BY MOUTH 2 (TWO) TIMES DAILY WITH A MEAL. 120 tablet 3   fluticasone (FLONASE) 50 MCG/ACT nasal spray Place 2 sprays into both nostrils daily. 16 g 0   meloxicam (MOBIC) 7.5 MG tablet TAKE 1 TABLET (7.5 MG TOTAL) BY MOUTH DAILY. 30 tablet 1   methocarbamol (ROBAXIN) 500 MG tablet TAKE 1 TABLET (500 MG TOTAL) BY MOUTH EVERY 8 (EIGHT) HOURS AS NEEDED FOR MUSCLE SPASMS. 90 tablet 0   OVER THE COUNTER MEDICATION Take 8 oz by mouth daily. Beet juice     Turmeric (QC TUMERIC COMPLEX PO) Take 2 capsules by mouth daily.     amLODipine (NORVASC) 10 MG tablet TAKE 1 TABLET (10 MG TOTAL) BY MOUTH DAILY. 90 tablet 1   atorvastatin (LIPITOR) 20 MG tablet TAKE 1 TABLET (20 MG TOTAL) BY MOUTH DAILY. 90 tablet 3   diclofenac Sodium (VOLTAREN) 1 % GEL Apply 2 g topically 4 (four) times daily. 100 g 0   gabapentin (NEURONTIN) 300 MG capsule TAKE 1 CAPSULE (300 MG TOTAL) BY MOUTH 3 (THREE) TIMES DAILY. 90 capsule 3   losartan (COZAAR) 100 MG tablet TAKE 1 TABLET  (100 MG TOTAL) BY MOUTH DAILY. 90 tablet 1   chlorhexidine (HIBICLENS) 4 % external liquid APPLY TOPICALLY DAILY AS NEEDED. (Patient not taking: Reported on 03/06/2021) 240 mL 3   citalopram (CELEXA) 20 MG tablet TAKE 1 TABLET (20 MG TOTAL) BY MOUTH DAILY. (Patient not taking: Reported on 03/06/2021) 30 tablet 3   No facility-administered medications prior to visit.    Allergies  Allergen Reactions   Lisinopril Cough       Objective:    BP 132/87   Pulse 66   Resp 16   Wt 183 lb 6.4 oz (83.2 kg)   SpO2 98%   BMI 32.50 kg/m  Wt Readings from Last 3 Encounters:  03/06/21 183 lb 6.4 oz (83.2 kg)  06/17/20  177 lb (80.3 kg)  03/03/20 174 lb (78.9 kg)    Physical Exam Vitals and nursing note reviewed.  Constitutional:      Appearance: She is well-developed.  HENT:     Head: Normocephalic and atraumatic.  Cardiovascular:     Rate and Rhythm: Normal rate and regular rhythm.     Heart sounds: Normal heart sounds. No murmur heard.   No friction rub. No gallop.  Pulmonary:     Effort: Pulmonary effort is normal. No tachypnea or respiratory distress.     Breath sounds: Normal breath sounds. No decreased breath sounds, wheezing, rhonchi or rales.  Chest:     Chest wall: No tenderness.  Abdominal:     General: Bowel sounds are normal.     Palpations: Abdomen is soft.  Musculoskeletal:        General: No swelling or deformity. Normal range of motion.     Cervical back: Normal range of motion.     Right lower leg: No edema.     Left lower leg: No edema.  Skin:    General: Skin is warm and dry.  Neurological:     Mental Status: She is alert and oriented to person, place, and time.     Coordination: Coordination normal.  Psychiatric:        Behavior: Behavior normal. Behavior is cooperative.        Thought Content: Thought content normal.        Judgment: Judgment normal.         Patient has been counseled extensively about nutrition and exercise as well as the importance  of adherence with medications and regular follow-up. The patient was given clear instructions to go to ER or return to medical center if symptoms don't improve, worsen or new problems develop. The patient verbalized understanding.   Follow-up: Return in about 3 months (around 06/05/2021).   Gildardo Pounds, FNP-BC [facility] and [place], [place]   03/06/2021, 3:16 PM

## 2021-03-06 NOTE — Progress Notes (Signed)
Left  side of neck pain

## 2021-03-07 LAB — LIPID PANEL
Chol/HDL Ratio: 3.8 ratio (ref 0.0–4.4)
Cholesterol, Total: 229 mg/dL — ABNORMAL HIGH (ref 100–199)
HDL: 61 mg/dL (ref >39)
LDL Chol Calc (NIH): 140 mg/dL — ABNORMAL HIGH (ref 0–99)
Triglycerides: 158 mg/dL — ABNORMAL HIGH (ref 0–149)
VLDL Cholesterol Cal: 28 mg/dL (ref 5–40)

## 2021-03-07 LAB — CMP14+EGFR
ALT: 16 IU/L (ref 0–32)
AST: 14 IU/L (ref 0–40)
Albumin/Globulin Ratio: 2 (ref 1.2–2.2)
Albumin: 4.3 g/dL (ref 3.8–4.8)
Alkaline Phosphatase: 79 IU/L (ref 44–121)
BUN/Creatinine Ratio: 18 (ref 12–28)
BUN: 16 mg/dL (ref 8–27)
Bilirubin Total: 0.4 mg/dL (ref 0.0–1.2)
CO2: 27 mmol/L (ref 20–29)
Calcium: 10.6 mg/dL — ABNORMAL HIGH (ref 8.7–10.3)
Chloride: 103 mmol/L (ref 96–106)
Creatinine, Ser: 0.87 mg/dL (ref 0.57–1.00)
Globulin, Total: 2.2 g/dL (ref 1.5–4.5)
Glucose: 90 mg/dL (ref 70–99)
Potassium: 4.8 mmol/L (ref 3.5–5.2)
Sodium: 141 mmol/L (ref 134–144)
Total Protein: 6.5 g/dL (ref 6.0–8.5)
eGFR: 75 mL/min/{1.73_m2} (ref >59)

## 2021-03-07 LAB — CBC
Hematocrit: 42.7 % (ref 34.0–46.6)
Hemoglobin: 13.3 g/dL (ref 11.1–15.9)
MCH: 23.7 pg — ABNORMAL LOW (ref 26.6–33.0)
MCHC: 31.1 g/dL — ABNORMAL LOW (ref 31.5–35.7)
MCV: 76 fL — ABNORMAL LOW (ref 79–97)
Platelets: 252 10*3/uL (ref 150–450)
RBC: 5.62 x10E6/uL — ABNORMAL HIGH (ref 3.77–5.28)
RDW: 15.7 % — ABNORMAL HIGH (ref 11.7–15.4)
WBC: 6.1 10*3/uL (ref 3.4–10.8)

## 2021-03-07 LAB — HEMOGLOBIN A1C
Est. average glucose Bld gHb Est-mCnc: 126 mg/dL
Hgb A1c MFr Bld: 6 % — ABNORMAL HIGH (ref 4.8–5.6)

## 2021-03-08 ENCOUNTER — Other Ambulatory Visit: Payer: Self-pay | Admitting: Nurse Practitioner

## 2021-03-08 MED ORDER — PRAVASTATIN SODIUM 20 MG PO TABS
20.0000 mg | ORAL_TABLET | Freq: Every day | ORAL | 3 refills | Status: AC
  Filled 2021-03-08: qty 30, 30d supply, fill #0
  Filled 2021-04-08 – 2021-04-16 (×2): qty 30, 30d supply, fill #1
  Filled 2021-05-19: qty 30, 30d supply, fill #2
  Filled 2021-07-28: qty 30, 30d supply, fill #3
  Filled 2021-07-29: qty 30, 30d supply, fill #0
  Filled 2021-10-02 – 2021-10-15 (×2): qty 30, 30d supply, fill #1
  Filled 2021-11-25: qty 30, 30d supply, fill #2
  Filled 2022-01-05 – 2022-01-11 (×2): qty 30, 30d supply, fill #3

## 2021-03-09 ENCOUNTER — Other Ambulatory Visit: Payer: Self-pay

## 2021-03-11 ENCOUNTER — Other Ambulatory Visit: Payer: Self-pay | Admitting: Family Medicine

## 2021-03-11 ENCOUNTER — Other Ambulatory Visit: Payer: Self-pay

## 2021-03-11 DIAGNOSIS — J309 Allergic rhinitis, unspecified: Secondary | ICD-10-CM

## 2021-03-11 MED ORDER — FLUTICASONE PROPIONATE 50 MCG/ACT NA SUSP
2.0000 | Freq: Every day | NASAL | 0 refills | Status: DC
  Filled 2021-03-11: qty 16, 30d supply, fill #0

## 2021-03-12 ENCOUNTER — Other Ambulatory Visit: Payer: Self-pay

## 2021-03-12 MED FILL — Ferrous Sulfate Tab 325 MG (65 MG Elemental Fe): ORAL | 30 days supply | Qty: 60 | Fill #0 | Status: AC

## 2021-03-15 ENCOUNTER — Other Ambulatory Visit: Payer: Self-pay | Admitting: Nurse Practitioner

## 2021-03-15 DIAGNOSIS — M5441 Lumbago with sciatica, right side: Secondary | ICD-10-CM

## 2021-03-15 DIAGNOSIS — G8929 Other chronic pain: Secondary | ICD-10-CM

## 2021-03-16 ENCOUNTER — Ambulatory Visit (HOSPITAL_COMMUNITY): Admission: RE | Admit: 2021-03-16 | Payer: Self-pay | Source: Ambulatory Visit

## 2021-03-16 ENCOUNTER — Telehealth: Payer: Self-pay

## 2021-03-16 NOTE — Telephone Encounter (Signed)
Copied from [place] [id number]. Topic: General - Call Back - No Documentation >> Mar 16, 2021 11:19 AM Erick Blinks wrote: Reason for CRM: Sherrie called from [facility] regarding a CT of the lumbar spine w/ contrast. She says she needs to speak with the clinic prior to doing this, has questions for PCP. Please advise  Best contact: [phone]  They say they sent Zelda a secure chat yesterday and they say they have yet to receive correspondence.

## 2021-03-17 ENCOUNTER — Other Ambulatory Visit: Payer: Self-pay

## 2021-03-26 ENCOUNTER — Other Ambulatory Visit: Payer: Self-pay

## 2021-04-01 ENCOUNTER — Other Ambulatory Visit: Payer: Self-pay

## 2021-04-08 ENCOUNTER — Other Ambulatory Visit: Payer: Self-pay

## 2021-04-15 ENCOUNTER — Other Ambulatory Visit: Payer: Self-pay

## 2021-04-16 ENCOUNTER — Other Ambulatory Visit: Payer: Self-pay | Admitting: Nurse Practitioner

## 2021-04-16 ENCOUNTER — Other Ambulatory Visit: Payer: Self-pay

## 2021-04-16 MED ORDER — MELOXICAM 7.5 MG PO TABS
ORAL_TABLET | Freq: Every day | ORAL | 2 refills | Status: DC
Start: 2021-04-16 — End: 2021-10-15
  Filled 2021-04-16: qty 30, fill #0
  Filled 2021-05-07: qty 30, 30d supply, fill #0
  Filled 2021-06-23: qty 30, 30d supply, fill #1
  Filled 2021-06-24 – 2021-08-28 (×4): qty 30, 30d supply, fill #0

## 2021-04-16 NOTE — Telephone Encounter (Signed)
Requested Prescriptions  Pending Prescriptions Disp Refills  . meloxicam (MOBIC) 7.5 MG tablet 30 tablet 1    Sig: TAKE 1 TABLET (7.5 MG TOTAL) BY MOUTH DAILY.     Analgesics:  COX2 Inhibitors Passed - 04/16/2021 12:13 PM      Passed - HGB in normal range and within 360 days    Hemoglobin  Date Value Ref Range Status  03/06/2021 13.3 11.1 - 15.9 g/dL Final         Passed - Cr in normal range and within 360 days    Creat  Date Value Ref Range Status  05/06/2014 0.80 0.50 - 1.10 mg/dL Final   Creatinine, Ser  Date Value Ref Range Status  03/06/2021 0.87 0.57 - 1.00 mg/dL Final         Passed - Patient is not pregnant      Passed - Valid encounter within last 12 months    Recent Outpatient Visits          1 month ago Essential hypertension   [place], Vernia Buff, NP   7 months ago Need for zoster vaccination   [place], RPH-CPP   10 months ago Essential hypertension   [place], Vernia Buff, NP   1 year ago Need for zoster vaccination   [place], RPH-CPP   1 year ago Essential hypertension   [place], Zelda W, NP      Future Appointments            In 1 month Gildardo Pounds, NP [place]

## 2021-05-07 ENCOUNTER — Other Ambulatory Visit: Payer: Self-pay | Admitting: Nurse Practitioner

## 2021-05-07 ENCOUNTER — Other Ambulatory Visit: Payer: Self-pay

## 2021-05-08 ENCOUNTER — Other Ambulatory Visit: Payer: Self-pay

## 2021-05-08 MED ORDER — METHOCARBAMOL 500 MG PO TABS
ORAL_TABLET | Freq: Three times a day (TID) | ORAL | 0 refills | Status: AC | PRN
  Filled 2021-05-08: qty 90, 30d supply, fill #0

## 2021-05-19 ENCOUNTER — Other Ambulatory Visit: Payer: Self-pay

## 2021-05-22 ENCOUNTER — Other Ambulatory Visit: Payer: Self-pay

## 2021-05-25 ENCOUNTER — Ambulatory Visit: Payer: Medicaid Other | Admitting: Nurse Practitioner

## 2021-06-17 ENCOUNTER — Ambulatory Visit: Payer: Self-pay | Admitting: *Deleted

## 2021-06-17 NOTE — Telephone Encounter (Signed)
° ° °  Chief Complaint: requesting antibiotics for eye  Symptoms: right eye redness Frequency: day before yesterday  Pertinent Negatives: Patient denies burred vision, pain or drainage  Disposition: [] ED /[] Urgent Care (no appt availability in office) / [] Appointment(In office/virtual)/ []  [facility] [facility]/ [] Home Care/ [] Refused Recommended Disposition /[x] [place] [facility]/ []  Follow-up with PCP Additional Notes:   Requesting medication .      Reason for Disposition  Redness persists > 24 hours  Answer Assessment - Initial Assessment Questions 1. TYPE OF CHEMICAL: "What's the name of the chemical?" If a brand name, ask "What's in it?"      No name given. Applied nail glue in right eye instead of eye drops  2. ONSET: "When did it happen?" (minutes or hours ago)      Day before yesterday  3. MECHANISM: "How did it happen?" ""How much got in your eye?" (e.g., a drop, a splash)     Applied nail glue in right eye instead of eye drops  4. FIRST AID: "What have you done so far?"  (e.g., immediate flushing [irrigation] is often essential)     Flushed eye immediately  5. VISION: "Do you have blurred vision?"      Denies  6. PAIN: "Is it painful?" If Yes, ask: "How bad is the pain?"  (Scale 1-10; or mild, moderate, severe)     Denies  7. CONTACTS: "Do you wear contacts?"     na 8. OTHER SYMPTOMS: "Do you have any other symptoms?"     Eye is very red  Protocols used: Eye - Chemical In-A-AH

## 2021-06-23 ENCOUNTER — Other Ambulatory Visit: Payer: Self-pay | Admitting: Nurse Practitioner

## 2021-06-23 DIAGNOSIS — J309 Allergic rhinitis, unspecified: Secondary | ICD-10-CM

## 2021-06-23 MED ORDER — FLUTICASONE PROPIONATE 50 MCG/ACT NA SUSP
2.0000 | Freq: Every day | NASAL | 0 refills | Status: DC
  Filled 2021-06-23 – 2021-06-24 (×2): qty 16, 30d supply, fill #0

## 2021-06-23 NOTE — Telephone Encounter (Signed)
Requested Prescriptions  Pending Prescriptions Disp Refills   fluticasone (FLONASE) 50 MCG/ACT nasal spray 16 g 0    Sig: Place 2 sprays into both nostrils daily.     Ear, Nose, and Throat: Nasal Preparations - Corticosteroids Passed - 06/23/2021  4:11 PM      Passed - Valid encounter within last 12 months    Recent Outpatient Visits          3 months ago Essential hypertension   [place], Zelda W, NP   9 months ago Need for zoster vaccination   [place], RPH-CPP   1 year ago Essential hypertension   [place], Zelda W, NP   1 year ago Need for zoster vaccination   [place], Jarome Matin, RPH-CPP   1 year ago Essential hypertension   [place], Zelda W, NP

## 2021-06-24 ENCOUNTER — Other Ambulatory Visit: Payer: Self-pay

## 2021-06-29 ENCOUNTER — Other Ambulatory Visit: Payer: Self-pay

## 2021-06-30 ENCOUNTER — Other Ambulatory Visit: Payer: Self-pay

## 2021-07-30 ENCOUNTER — Other Ambulatory Visit: Payer: Self-pay

## 2021-08-05 ENCOUNTER — Other Ambulatory Visit: Payer: Self-pay

## 2021-08-17 ENCOUNTER — Other Ambulatory Visit: Payer: Self-pay | Admitting: Nurse Practitioner

## 2021-08-17 ENCOUNTER — Other Ambulatory Visit: Payer: Self-pay

## 2021-08-17 ENCOUNTER — Other Ambulatory Visit (HOSPITAL_COMMUNITY): Payer: Self-pay

## 2021-08-17 DIAGNOSIS — G8929 Other chronic pain: Secondary | ICD-10-CM

## 2021-08-17 NOTE — Telephone Encounter (Signed)
Pt called to report that she is leaving town tomorrow morning. She is requesting to have this refilled today after 2pm.  ?

## 2021-08-18 NOTE — Telephone Encounter (Signed)
Requested medications are due for refill today.  yes ? ?Requested medications are on the active medications list.  yes ? ?Last refill. 03/06/2021 #30 3 refills ? ?Future visit scheduled.   no ? ?Notes to clinic.  Rx written to expire 07/30/2021 - rx is expired. ? ? ? ?Requested Prescriptions  ?Pending Prescriptions Disp Refills  ? DULoxetine (CYMBALTA) 30 MG capsule 30 capsule 3  ?  Sig: Take 1 capsule (30 mg total) by mouth daily.  ?  ? Psychiatry: Antidepressants - SNRI - duloxetine Passed - 08/17/2021 10:59 AM  ?  ?  Passed - Cr in normal range and within 360 days  ?  Creat  ?Date Value Ref Range Status  ?05/06/2014 0.80 0.50 - 1.10 mg/dL Final  ? ?Creatinine, Ser  ?Date Value Ref Range Status  ?03/06/2021 0.87 0.57 - 1.00 mg/dL Final  ?  ?  ?  ?  Passed - eGFR is 30 or above and within 360 days  ?  GFR, Est African American  ?Date Value Ref Range Status  ?05/06/2014 >89 mL/min Final  ? ?GFR calc Af Amer  ?Date Value Ref Range Status  ?06/17/2020 87 >59 mL/min/1.73 Final  ?  Comment:  ?  **In accordance with recommendations from the NKF-ASN Task force,** ?  [facility] is in the process of updating its eGFR calculation to the ?  2021 CKD-EPI creatinine equation that estimates kidney function ?  without a race variable. ?  ? ?GFR, Est Non African American  ?Date Value Ref Range Status  ?05/06/2014 83 mL/min Final  ?  Comment:  ?    ?The estimated GFR is a calculation valid for adults (>=24 years old) ?that uses the CKD-EPI algorithm to adjust for age and sex. It is   ?not to be used for children, pregnant women, hospitalized patients,    ?patients on dialysis, or with rapidly changing kidney function. ?According to the NKDEP, eGFR >89 is normal, 60-89 shows mild ?impairment, 30-59 shows moderate impairment, 15-29 shows severe ?impairment and <15 is ESRD. ?  ?  ? ?GFR calc non Af Amer  ?Date Value Ref Range Status  ?06/17/2020 75 >59 mL/min/1.73 Final  ? ?eGFR  ?Date Value Ref Range Status  ?03/06/2021 75 >59 mL/min/1.73  Final  ?  ?  ?  ?  Passed - Completed PHQ-2 or PHQ-9 in the last 360 days  ?  ?  Passed - Last BP in normal range  ?  BP Readings from Last 1 Encounters:  ?03/06/21 132/87  ?  ?  ?  ?  Passed - Valid encounter within last 6 months  ?  Recent Outpatient Visits   ? ?      ? 5 months ago Essential hypertension  ? [place], [place] W, NP  ? 11 months ago Need for zoster vaccination  ? [place], RPH-CPP  ? 1 year ago Essential hypertension  ? [place], Vernia Buff, NP  ? 1 year ago Need for zoster vaccination  ? [place], RPH-CPP  ? 1 year ago Essential hypertension  ? [place], Vernia Buff, NP  ? ?  ?  ? ?  ?  ?  ? ?

## 2021-08-18 NOTE — Telephone Encounter (Signed)
Patient called in to say to Leslie Steele that she need this refill today since she will be going out of town this afternoon should be leaving at [facility] [address] Ph# [phone] ?

## 2021-08-19 ENCOUNTER — Other Ambulatory Visit: Payer: Self-pay

## 2021-08-19 MED ORDER — DULOXETINE HCL 30 MG PO CPEP
30.0000 mg | ORAL_CAPSULE | Freq: Every day | ORAL | 0 refills | Status: DC
  Filled 2021-08-19 – 2021-08-28 (×2): qty 30, 30d supply, fill #0

## 2021-08-24 ENCOUNTER — Other Ambulatory Visit: Payer: Self-pay

## 2021-08-26 ENCOUNTER — Other Ambulatory Visit: Payer: Self-pay

## 2021-08-28 ENCOUNTER — Other Ambulatory Visit: Payer: Self-pay

## 2021-09-01 ENCOUNTER — Other Ambulatory Visit: Payer: Self-pay

## 2021-09-04 ENCOUNTER — Other Ambulatory Visit: Payer: Self-pay

## 2021-10-02 ENCOUNTER — Other Ambulatory Visit: Payer: Self-pay

## 2021-10-02 ENCOUNTER — Other Ambulatory Visit: Payer: Self-pay | Admitting: Family Medicine

## 2021-10-02 ENCOUNTER — Other Ambulatory Visit: Payer: Self-pay | Admitting: Nurse Practitioner

## 2021-10-02 DIAGNOSIS — G8929 Other chronic pain: Secondary | ICD-10-CM

## 2021-10-06 ENCOUNTER — Other Ambulatory Visit: Payer: Self-pay | Admitting: Family Medicine

## 2021-10-06 ENCOUNTER — Other Ambulatory Visit: Payer: Self-pay | Admitting: Nurse Practitioner

## 2021-10-06 DIAGNOSIS — G8929 Other chronic pain: Secondary | ICD-10-CM

## 2021-10-07 ENCOUNTER — Other Ambulatory Visit: Payer: Self-pay

## 2021-10-09 ENCOUNTER — Other Ambulatory Visit: Payer: Self-pay

## 2021-10-13 NOTE — Telephone Encounter (Signed)
Requested medication (s) are due for refill today: yes ? ?Requested medication (s) are on the active medication list: yes shows Prescription for Cymbalta ended 10/01/21 ? ?Last refill:  Meloxicam: 04/16/21      Cymbalta:08/19/21                    Robaxin:05/08/21 ? ?Future visit scheduled: yes August 9 ? ?Notes to clinic:  pt asking for refill of above meds to last until date of appt- the appt is in 3 months ? ? ?Requested Prescriptions  ?Pending Prescriptions Disp Refills  ? meloxicam (MOBIC) 7.5 MG tablet 30 tablet 2  ?  Sig: TAKE 1 TABLET (7.5 MG TOTAL) BY MOUTH DAILY.  ?  ? Analgesics:  COX2 Inhibitors Failed - 10/13/2021 12:44 PM  ?  ?  Failed - Manual Review: Labs are only required if the patient has taken medication for more than 8 weeks.  ?  ?  Passed - HGB in normal range and within 360 days  ?  Hemoglobin  ?Date Value Ref Range Status  ?03/06/2021 13.3 11.1 - 15.9 g/dL Final  ?  ?  ?  ?  Passed - Cr in normal range and within 360 days  ?  Creat  ?Date Value Ref Range Status  ?05/06/2014 0.80 0.50 - 1.10 mg/dL Final  ? ?Creatinine, Ser  ?Date Value Ref Range Status  ?03/06/2021 0.87 0.57 - 1.00 mg/dL Final  ?  ?  ?  ?  Passed - HCT in normal range and within 360 days  ?  Hematocrit  ?Date Value Ref Range Status  ?03/06/2021 42.7 34.0 - 46.6 % Final  ?  ?  ?  ?  Passed - AST in normal range and within 360 days  ?  AST  ?Date Value Ref Range Status  ?03/06/2021 14 0 - 40 IU/L Final  ?  ?  ?  ?  Passed - ALT in normal range and within 360 days  ?  ALT  ?Date Value Ref Range Status  ?03/06/2021 16 0 - 32 IU/L Final  ?  ?  ?  ?  Passed - eGFR is 30 or above and within 360 days  ?  GFR, Est African American  ?Date Value Ref Range Status  ?05/06/2014 >89 mL/min Final  ? ?GFR calc Af Amer  ?Date Value Ref Range Status  ?06/17/2020 87 >59 mL/min/1.73 Final  ?  Comment:  ?  **In accordance with recommendations from the NKF-ASN Task force,** ?  [facility] is in the process of updating its eGFR calculation to the ?  2021  CKD-EPI creatinine equation that estimates kidney function ?  without a race variable. ?  ? ?GFR, Est Non African American  ?Date Value Ref Range Status  ?05/06/2014 83 mL/min Final  ?  Comment:  ?    ?The estimated GFR is a calculation valid for adults (>=77 years old) ?that uses the CKD-EPI algorithm to adjust for age and sex. It is   ?not to be used for children, pregnant women, hospitalized patients,    ?patients on dialysis, or with rapidly changing kidney function. ?According to the NKDEP, eGFR >89 is normal, 60-89 shows mild ?impairment, 30-59 shows moderate impairment, 15-29 shows severe ?impairment and <15 is ESRD. ?  ?  ? ?GFR calc non Af Amer  ?Date Value Ref Range Status  ?06/17/2020 75 >59 mL/min/1.73 Final  ? ?eGFR  ?Date Value Ref Range Status  ?03/06/2021 75 >59 mL/min/1.73 Final  ?  ?  ?  ?  Passed - Patient is not pregnant  ?  ?  Passed - Valid encounter within last 12 months  ?  Recent Outpatient Visits   ? ?      ? 7 months ago Essential hypertension  ? [place], Vernia Buff, NP  ? 1 year ago Need for zoster vaccination  ? [place], RPH-CPP  ? 1 year ago Essential hypertension  ? [place], Vernia Buff, NP  ? 1 year ago Need for zoster vaccination  ? [place], RPH-CPP  ? 1 year ago Essential hypertension  ? [place] Gildardo Pounds, NP  ? ?  ?  ?Future Appointments   ? ?        ? In 3 months Gildardo Pounds, NP [place]  ? ?  ? ? ?  ?  ?  ? DULoxetine (CYMBALTA) 30 MG capsule 30 capsule 0  ?  Sig: Take 1 capsule (30 mg total) by mouth daily.  ?  ? Psychiatry: Antidepressants - SNRI - duloxetine Failed - 10/13/2021 12:44 PM  ?  ?  Failed - Valid encounter within last 6 months  ?  Recent Outpatient Visits   ? ?      ? 7 months ago Essential  hypertension  ? [place], Vernia Buff, NP  ? 1 year ago Need for zoster vaccination  ? [place], RPH-CPP  ? 1 year ago Essential hypertension  ? [place], Vernia Buff, NP  ? 1 year ago Need for zoster vaccination  ? [place], RPH-CPP  ? 1 year ago Essential hypertension  ? [place] Gildardo Pounds, NP  ? ?  ?  ?Future Appointments   ? ?        ? In 3 months Gildardo Pounds, NP [place]  ? ?  ? ? ?  ?  ?  Passed - Cr in normal range and within 360 days  ?  Creat  ?Date Value Ref Range Status  ?05/06/2014 0.80 0.50 - 1.10 mg/dL Final  ? ?Creatinine, Ser  ?Date Value Ref Range Status  ?03/06/2021 0.87 0.57 - 1.00 mg/dL Final  ?  ?  ?  ?  Passed - eGFR is 30 or above and within 360 days  ?  GFR, Est African American  ?Date Value Ref Range Status  ?05/06/2014 >89 mL/min Final  ? ?GFR calc Af Amer  ?Date Value Ref Range Status  ?06/17/2020 87 >59 mL/min/1.73 Final  ?  Comment:  ?  **In accordance with recommendations from the NKF-ASN Task force,** ?  [facility] is in the process of updating its eGFR calculation to the ?  2021 CKD-EPI creatinine equation that estimates kidney function ?  without a race variable. ?  ? ?GFR, Est Non African American  ?Date Value Ref Range Status  ?05/06/2014 83 mL/min Final  ?  Comment:  ?    ?The estimated GFR is a calculation valid for adults (>=56 years old) ?that uses the CKD-EPI algorithm to adjust for age and sex. It is   ?not to be used for children, pregnant  women, hospitalized patients,    ?patients on dialysis, or with rapidly changing kidney function. ?According to the NKDEP, eGFR >89 is normal, 60-89 shows mild ?impairment, 30-59 shows moderate impairment, 15-29 shows severe ?impairment and <15 is ESRD. ?  ?  ? ?GFR calc non Af Amer   ?Date Value Ref Range Status  ?06/17/2020 75 >59 mL/min/1.73 Final  ? ?eGFR  ?Date Value Ref Range Status  ?03/06/2021 75 >59 mL/min/1.73 Final  ?  ?  ?  ?  Passed - Completed PHQ-2 or PHQ-9 in the last 360 days  ?  ?  Passed - Last BP in normal range  ?  BP Readings from Last 1 Encounters:  ?03/06/21 132/87  ?  ?  ?  ?  ? methocarbamol (ROBAXIN) 500 MG tablet 90 tablet 0  ?  Sig: TAKE 1 TABLET (500 MG TOTAL) BY MOUTH EVERY 8 (EIGHT) HOURS AS NEEDED FOR MUSCLE SPASMS.  ?  ? Not Delegated - Analgesics:  Muscle Relaxants Failed - 10/13/2021 12:44 PM  ?  ?  Failed - This refill cannot be delegated  ?  ?  Failed - Valid encounter within last 6 months  ?  Recent Outpatient Visits   ? ?      ? 7 months ago Essential hypertension  ? [place], Vernia Buff, NP  ? 1 year ago Need for zoster vaccination  ? [place], RPH-CPP  ? 1 year ago Essential hypertension  ? [place], Vernia Buff, NP  ? 1 year ago Need for zoster vaccination  ? [place], RPH-CPP  ? 1 year ago Essential hypertension  ? [place] Gildardo Pounds, NP  ? ?  ?  ?Future Appointments   ? ?        ? In 3 months Gildardo Pounds, NP [place]  ? ?  ? ? ?  ?  ?  ?Refused Prescriptions Disp Refills  ? meloxicam (MOBIC) 7.5 MG tablet 30 tablet 2  ?  Sig: TAKE 1 TABLET (7.5 MG TOTAL) BY MOUTH DAILY.  ?  ? Analgesics:  COX2 Inhibitors Failed - 10/13/2021 12:44 PM  ?  ?  Failed - Manual Review: Labs are only required if the patient has taken medication for more than 8 weeks.  ?  ?  Passed - HGB in normal range and within 360 days  ?  Hemoglobin  ?Date Value Ref Range Status  ?03/06/2021 13.3 11.1 - 15.9 g/dL Final  ?  ?  ?  ?  Passed - Cr in normal range and within 360 days  ?  Creat  ?Date Value Ref Range Status  ?05/06/2014  0.80 0.50 - 1.10 mg/dL Final  ? ?Creatinine, Ser  ?Date Value Ref Range Status  ?03/06/2021 0.87 0.57 - 1.00 mg/dL Final  ?  ?  ?  ?  Passed - HCT in normal range and within 360 days  ?  Hematocrit  ?Date Value Ref

## 2021-10-13 NOTE — Telephone Encounter (Signed)
Pt called and scheduled next available with PCP. She wants refills today to last her until her scheduled time, and if you can work her in sooner please advise ? ?Pt needs meloxicam (MOBIC) 7.5 MG tablet  ?methocarbamol (ROBAXIN) 500 MG tablet  ?DULoxetine (CYMBALTA) 30 MG capsule ? ? ?[facility], [place] [address]  ?Phone: [phone] Fax: [phone]  ? ?

## 2021-10-13 NOTE — Addendum Note (Signed)
Addended by: Matilde Sprang on: 10/13/2021 12:44 PM ? ? Modules accepted: Orders ? ?

## 2021-10-15 ENCOUNTER — Other Ambulatory Visit: Payer: Self-pay | Admitting: Family Medicine

## 2021-10-15 ENCOUNTER — Other Ambulatory Visit: Payer: Self-pay

## 2021-10-15 ENCOUNTER — Other Ambulatory Visit: Payer: Self-pay | Admitting: Nurse Practitioner

## 2021-10-15 DIAGNOSIS — G8929 Other chronic pain: Secondary | ICD-10-CM

## 2021-10-15 MED ORDER — MELOXICAM 7.5 MG PO TABS
ORAL_TABLET | Freq: Every day | ORAL | 2 refills | Status: DC
  Filled 2021-10-15: qty 30, 30d supply, fill #0
  Filled 2021-11-25: qty 30, 30d supply, fill #1
  Filled 2022-01-05 – 2022-01-11 (×2): qty 30, 30d supply, fill #2

## 2021-10-15 MED ORDER — DULOXETINE HCL 30 MG PO CPEP
30.0000 mg | ORAL_CAPSULE | Freq: Every day | ORAL | 2 refills | Status: DC
  Filled 2021-10-15: qty 30, 30d supply, fill #0
  Filled 2021-11-25: qty 30, 30d supply, fill #1
  Filled 2022-01-05 – 2022-01-11 (×2): qty 30, 30d supply, fill #2

## 2021-10-20 ENCOUNTER — Other Ambulatory Visit: Payer: Self-pay

## 2021-11-25 ENCOUNTER — Other Ambulatory Visit: Payer: Self-pay

## 2022-01-05 ENCOUNTER — Other Ambulatory Visit: Payer: Self-pay | Admitting: Nurse Practitioner

## 2022-01-05 ENCOUNTER — Other Ambulatory Visit: Payer: Self-pay

## 2022-01-05 DIAGNOSIS — J309 Allergic rhinitis, unspecified: Secondary | ICD-10-CM

## 2022-01-05 DIAGNOSIS — I10 Essential (primary) hypertension: Secondary | ICD-10-CM

## 2022-01-06 ENCOUNTER — Other Ambulatory Visit: Payer: Self-pay

## 2022-01-06 MED ORDER — AMLODIPINE BESYLATE 10 MG PO TABS
ORAL_TABLET | Freq: Every day | ORAL | 0 refills | Status: DC
  Filled 2022-01-06: qty 30, 30d supply, fill #0

## 2022-01-06 MED ORDER — FLUTICASONE PROPIONATE 50 MCG/ACT NA SUSP
2.0000 | Freq: Every day | NASAL | 0 refills | Status: DC
  Filled 2022-01-06: qty 16, 30d supply, fill #0

## 2022-01-11 ENCOUNTER — Other Ambulatory Visit: Payer: Self-pay

## 2022-01-13 ENCOUNTER — Ambulatory Visit: Payer: Medicaid Other | Admitting: Nurse Practitioner

## 2022-02-24 ENCOUNTER — Encounter (HOSPITAL_COMMUNITY): Payer: Self-pay | Admitting: Emergency Medicine

## 2022-02-24 ENCOUNTER — Ambulatory Visit (HOSPITAL_COMMUNITY)
Admission: EM | Admit: 2022-02-24 | Discharge: 2022-02-24 | Disposition: A | Discharge status: Home / Self Care | Payer: No Typology Code available for payment source | Attending: Nurse Practitioner | Admitting: Nurse Practitioner

## 2022-02-24 DIAGNOSIS — N3000 Acute cystitis without hematuria: Secondary | ICD-10-CM | POA: Insufficient documentation

## 2022-02-24 DIAGNOSIS — Z113 Encounter for screening for infections with a predominantly sexual mode of transmission: Secondary | ICD-10-CM | POA: Insufficient documentation

## 2022-02-24 DIAGNOSIS — R300 Dysuria: Secondary | ICD-10-CM | POA: Insufficient documentation

## 2022-02-24 LAB — POCT URINALYSIS DIPSTICK, ED / UC
Bilirubin Urine: NEGATIVE
Glucose, UA: NEGATIVE mg/dL
Hgb urine dipstick: NEGATIVE
Ketones, ur: NEGATIVE mg/dL
Leukocytes,Ua: NEGATIVE
Nitrite: POSITIVE — AB
Protein, ur: NEGATIVE mg/dL
Specific Gravity, Urine: 1.025 (ref 1.005–1.030)
Urobilinogen, UA: 1 mg/dL (ref 0.0–1.0)
pH: 6.5 (ref 5.0–8.0)

## 2022-02-24 MED ORDER — NITROFURANTOIN MONOHYD MACRO 100 MG PO CAPS: 100.0000 mg | ORAL_CAPSULE | Freq: Two times a day (BID) | ORAL | 0 refills | Status: DC

## 2022-02-24 NOTE — ED Triage Notes (Signed)
Pt reports dysuria x 3 days. States she was taking AZO for 2 days with no relief. Denies any other urinary symptoms.

## 2022-02-24 NOTE — ED Provider Notes (Signed)
[facility]    CSN: [id number] Arrival date & time: 02/24/22  1853      History   Chief Complaint Chief Complaint  Patient presents with   Dysuria    HPI Leslie Steele is a 61 y.o. female.   Subjective:  Leslie Steele is a 61 y.o. female who complains of dysuria for the past 4 days. Patient denies stomach ache, vaginal discharge or urinary frequency. Patient does not have a history of recurrent UTI.  Patient does not have a history of pyelonephritis. She tried AZO with some relief in symptoms but not complete resolution. She recently had a new sexual partner and did not use any barrier protection. Patient is menopausal s/p hysterectomy.   The following portions of the patient's history were reviewed and updated as appropriate: allergies, current medications, past family history, past medical history, past social history, past surgical history, and problem list.      Past Medical History:  Diagnosis Date   Depression    Hypertension     Patient Active Problem List   Diagnosis Date Noted   UTI (urinary tract infection) 06/11/2013   Dyslipidemia 06/11/2013   Preventative health care 06/11/2013   Essential hypertension 02/14/2013    Past Surgical History:  Procedure Laterality Date   ABDOMINAL HYSTERECTOMY     DG THUMB LEFT HAND     screws in hand    OB History   No obstetric history on file.      Home Medications    Prior to Admission medications   Medication Sig Start Date End Date Taking? Authorizing Provider  nitrofurantoin, macrocrystal-monohydrate, (MACROBID) 100 MG capsule Take 1 capsule (100 mg total) by mouth 2 (two) times daily. 02/24/22  Yes Enrique Sack, FNP  acetaminophen (TYLENOL) 500 MG tablet Take 1,000 mg by mouth every 4 (four) hours as needed for pain (headache).    [provider]  amLODipine (NORVASC) 10 MG tablet TAKE 1 TABLET (10 MG TOTAL) BY MOUTH DAILY. 01/06/22   Charlott Rakes, MD  diclofenac Sodium  (VOLTAREN) 1 % GEL Apply 2 g topically 4 (four) times daily. 03/06/21 03/06/22  Gildardo Pounds, NP  DULoxetine (CYMBALTA) 30 MG capsule Take 1 capsule (30 mg total) by mouth daily. 10/15/21   Charlott Rakes, MD  ELDERBERRY PO Take 2 capsules by mouth daily.    [provider]  ferrous sulfate 325 (65 FE) MG tablet TAKE 1 TABLET (325 MG TOTAL) BY MOUTH 2 (TWO) TIMES DAILY WITH A MEAL. 06/17/20 06/17/21  Gildardo Pounds, NP  fluticasone (FLONASE) 50 MCG/ACT nasal spray Place 2 sprays into both nostrils daily. 01/06/22 01/06/23  Charlott Rakes, MD  losartan (COZAAR) 100 MG tablet TAKE 1 TABLET (100 MG TOTAL) BY MOUTH DAILY. 03/06/21 03/06/22  Gildardo Pounds, NP  meloxicam (MOBIC) 7.5 MG tablet TAKE 1 TABLET (7.5 MG TOTAL) BY MOUTH DAILY. 10/15/21 10/15/22  Charlott Rakes, MD  methocarbamol (ROBAXIN) 500 MG tablet TAKE 1 TABLET (500 MG TOTAL) BY MOUTH EVERY 8 (EIGHT) HOURS AS NEEDED FOR MUSCLE SPASMS. 05/08/21 05/08/22  Charlott Rakes, MD  OVER THE COUNTER MEDICATION Take 8 oz by mouth daily. Beet juice    [provider]  pravastatin (PRAVACHOL) 20 MG tablet Take 1 tablet (20 mg total) by mouth daily. 03/08/21   Gildardo Pounds, NP  Turmeric (QC TUMERIC COMPLEX PO) Take 2 capsules by mouth daily.    [provider]  lisinopril (PRINIVIL,ZESTRIL) 10 MG tablet TAKE 1 TABLET BY MOUTH  EVERY DAY 04/22/15 11/09/19  Tresa Garter, MD    Family History Family History  Problem Relation Age of Onset   Hypertension Mother    Breast cancer Mother    Asthma Mother    Hypertension Father    Aneurysm Father    Prostate cancer Father    Asthma Son     Social History Social History   Tobacco Use   Smoking status: Never   Smokeless tobacco: Never  Substance Use Topics   Alcohol use: Yes    Comment: occ   Drug use: No     Allergies   Lisinopril   Review of Systems Review of Systems  Constitutional:  Negative for fever.  Gastrointestinal:  Negative for nausea and  vomiting.  Genitourinary:  Positive for dysuria. Negative for frequency and vaginal discharge.  Musculoskeletal:  Negative for back pain.     Physical Exam Triage Vital Signs ED Triage Vitals  Enc Vitals Group     BP 02/24/22 1917 (!) 127/104     Pulse Rate 02/24/22 1917 100     Resp 02/24/22 1917 18     Temp 02/24/22 1917 98.2 F (36.8 C)     Temp Source 02/24/22 1917 Oral     SpO2 02/24/22 1917 100 %     Weight --      Height --      Head Circumference --      Peak Flow --      Pain Score 02/24/22 1916 0     Pain Loc --      Pain Edu? --      Excl. in [place]? --    No data found.  Updated Vital Signs BP (!) 127/104 (BP Location: Right Arm)   Pulse 100   Temp 98.2 F (36.8 C) (Oral)   Resp 18   SpO2 100%   Visual Acuity Right Eye Distance:   Left Eye Distance:   Bilateral Distance:    Right Eye Near:   Left Eye Near:    Bilateral Near:     Physical Exam Vitals reviewed.  Constitutional:      General: She is not in acute distress.    Appearance: Normal appearance. She is not ill-appearing or diaphoretic.  HENT:     Head: Normocephalic.  Cardiovascular:     Rate and Rhythm: Normal rate.  Pulmonary:     Effort: Pulmonary effort is normal.  Abdominal:     Palpations: Abdomen is soft.     Tenderness: There is no right CVA tenderness or left CVA tenderness.  Genitourinary:    Comments: Deferred; patient perform self swab for testing Musculoskeletal:        General: Normal range of motion.     Cervical back: Normal range of motion and neck supple.  Skin:    General: Skin is warm and dry.  Neurological:     General: No focal deficit present.     Mental Status: She is alert and oriented to person, place, and time.      UC Treatments / Results  Labs (all labs ordered are listed, but only abnormal results are displayed) Labs Reviewed  POCT URINALYSIS DIPSTICK, ED / UC - Abnormal; Notable for the following components:      Result Value   Nitrite  POSITIVE (*)    All other components within normal limits  URINE CULTURE  CERVICOVAGINAL ANCILLARY ONLY    EKG   Radiology No results found.  Procedures Procedures (including critical  care time)  Medications Ordered in UC Medications - No data to display  Initial Impression / Assessment and Plan / UC Course  I have reviewed the triage vital signs and the nursing notes.  Pertinent labs & imaging results that were available during my care of the patient were reviewed by me and considered in my medical decision making (see chart for details).    61 yo female presented with a 4-day history of dysuria without any other symptoms. Urinalysis positive for nitrites.  Urine cultures pending.  Will start patient on Macrobid empirically. The patient recently had a new sexual partner and did not use any barrier protection.  STD screening also pending.  Safe sex practices discussed.  Further recommendations pending results of outstanding labs and urine cultures.  Today's evaluation has revealed no signs of a dangerous process. Discussed diagnosis with patient and/or guardian. Patient and/or guardian aware of their diagnosis, possible red flag symptoms to watch out for and need for close follow up. Patient and/or guardian understands verbal and written discharge instructions. Patient and/or guardian comfortable with plan and disposition.  Patient and/or guardian has a clear mental status at this time, good insight into illness (after discussion and teaching) and has clear judgment to make decisions regarding their care  Documentation was completed with the aid of voice recognition software. Transcription may contain typographical errors. Final Clinical Impressions(s) / UC Diagnoses   Final diagnoses:  Dysuria  Acute cystitis without hematuria  Screening for STD (sexually transmitted disease)     Discharge Instructions      Take antibiotics as prescribed for the treatment of urinary tract  infection. Testing for gonorrhea, chlamydia and trichomonas is pending. You should not have any sexual activity until you receive the results of the tests. You will only be notified for positive results. You may go online to [facility] and review your results. Practice safe sex practices by wearing a condom every time you have sex. Remember that people who have STIs may not experience any symptoms.      ED Prescriptions     Medication Sig Dispense Auth. Provider   nitrofurantoin, macrocrystal-monohydrate, (MACROBID) 100 MG capsule Take 1 capsule (100 mg total) by mouth 2 (two) times daily. 10 capsule Enrique Sack, FNP      PDMP not reviewed this encounter.   Enrique Sack, [place] 02/24/22 1945

## 2022-02-24 NOTE — Discharge Instructions (Addendum)
Take antibiotics as prescribed for the treatment of urinary tract infection. Testing for gonorrhea, chlamydia and trichomonas is pending. You should not have any sexual activity until you receive the results of the tests. You will only be notified for positive results. You may go online to [facility] and review your results. Practice safe sex practices by wearing a condom every time you have sex. Remember that people who have STIs may not experience any symptoms.

## 2022-02-25 LAB — URINE CULTURE: Culture: NO GROWTH

## 2022-02-25 LAB — CERVICOVAGINAL ANCILLARY ONLY
Bacterial Vaginitis (gardnerella): POSITIVE — AB
Candida Glabrata: NEGATIVE
Candida Vaginitis: POSITIVE — AB
Chlamydia: NEGATIVE
Comment: NEGATIVE
Comment: NEGATIVE
Comment: NEGATIVE
Comment: NEGATIVE
Comment: NEGATIVE
Comment: NORMAL
Neisseria Gonorrhea: NEGATIVE
Trichomonas: NEGATIVE

## 2022-02-26 ENCOUNTER — Telehealth (HOSPITAL_COMMUNITY): Payer: Self-pay

## 2022-02-26 ENCOUNTER — Other Ambulatory Visit: Payer: Self-pay

## 2022-02-26 MED ORDER — FLUCONAZOLE 150 MG PO TABS
150.0000 mg | ORAL_TABLET | Freq: Every day | ORAL | 0 refills | Status: AC
  Filled 2022-02-26: qty 2, 2d supply, fill #0

## 2022-02-26 MED ORDER — METRONIDAZOLE 500 MG PO TABS
500.0000 mg | ORAL_TABLET | Freq: Two times a day (BID) | ORAL | 0 refills | Status: AC
  Filled 2022-02-26: qty 14, 7d supply, fill #0

## 2022-03-02 ENCOUNTER — Telehealth: Payer: Self-pay

## 2022-03-02 NOTE — Telephone Encounter (Signed)
Telephoned patient at mobile number. Left voice message with [employer] (scholarship) contact information. 

## 2022-03-03 NOTE — Telephone Encounter (Signed)
Telephoned patient and confirmed patient has private insurance. Patient ineligible for mammogram scholarship or [employer].

## 2022-03-24 ENCOUNTER — Other Ambulatory Visit: Payer: Self-pay

## 2022-03-24 MED ORDER — LOSARTAN POTASSIUM 100 MG PO TABS
100.0000 mg | ORAL_TABLET | Freq: Every day | ORAL | 3 refills | Status: AC
  Filled 2022-03-24: qty 90, 90d supply, fill #0
  Filled 2022-09-09: qty 90, 90d supply, fill #1
  Filled 2022-09-13 – 2022-12-13 (×2): qty 90, 90d supply, fill #2

## 2022-03-24 MED ORDER — DULOXETINE HCL 30 MG PO CPEP
30.0000 mg | ORAL_CAPSULE | Freq: Every day | ORAL | 3 refills | Status: DC
  Filled 2022-03-24: qty 90, 90d supply, fill #0
  Filled 2022-12-13: qty 90, 90d supply, fill #1

## 2022-03-24 MED ORDER — METHOCARBAMOL 500 MG PO TABS
500.0000 mg | ORAL_TABLET | Freq: Three times a day (TID) | ORAL | 3 refills | Status: AC | PRN
  Filled 2022-03-24: qty 56, 19d supply, fill #0
  Filled 2022-03-25: qty 34, 11d supply, fill #0
  Filled 2022-09-13 – 2022-12-13 (×2): qty 90, 30d supply, fill #1

## 2022-03-24 MED ORDER — MELOXICAM 7.5 MG PO TABS
7.5000 mg | ORAL_TABLET | Freq: Every day | ORAL | 3 refills | Status: AC
  Filled 2022-03-24: qty 90, 90d supply, fill #0
  Filled 2022-06-09: qty 90, 90d supply, fill #1
  Filled 2022-12-13: qty 90, 90d supply, fill #2

## 2022-03-24 MED ORDER — FLUTICASONE PROPIONATE 50 MCG/ACT NA SUSP
2.0000 | Freq: Every day | NASAL | 3 refills | Status: DC
  Filled 2022-03-24: qty 16, 30d supply, fill #0
  Filled 2022-06-09: qty 16, 30d supply, fill #1
  Filled 2022-12-13: qty 16, 30d supply, fill #2

## 2022-03-24 MED ORDER — AMLODIPINE BESYLATE 10 MG PO TABS
10.0000 mg | ORAL_TABLET | Freq: Every day | ORAL | 3 refills | Status: AC
  Filled 2022-03-24: qty 90, 90d supply, fill #0
  Filled 2022-09-13 – 2022-12-13 (×2): qty 90, 90d supply, fill #1

## 2022-03-25 ENCOUNTER — Other Ambulatory Visit: Payer: Self-pay

## 2022-04-06 ENCOUNTER — Other Ambulatory Visit: Payer: Self-pay

## 2022-04-06 MED ORDER — VITAMIN D3 ULTRA POTENCY 1250 MCG PO TABS
1.0000 | ORAL_TABLET | ORAL | 0 refills | Status: AC
  Filled 2022-06-03 – 2022-06-09 (×2): qty 12, fill #0

## 2022-04-14 ENCOUNTER — Other Ambulatory Visit: Payer: Self-pay

## 2022-04-14 MED ORDER — ATORVASTATIN CALCIUM 40 MG PO TABS
40.0000 mg | ORAL_TABLET | Freq: Every evening | ORAL | 0 refills | Status: AC
  Filled 2022-04-14: qty 90, 90d supply, fill #0

## 2022-04-14 MED ORDER — FAMOTIDINE 20 MG PO TABS
20.0000 mg | ORAL_TABLET | Freq: Every day | ORAL | 0 refills | Status: AC
  Filled 2022-04-14: qty 14, 14d supply, fill #0

## 2022-04-14 MED ORDER — D3-50 1.25 MG (50000 UT) PO CAPS
50000.0000 [IU] | ORAL_CAPSULE | ORAL | 0 refills | Status: AC
  Filled 2022-05-03: qty 4, 28d supply, fill #0
  Filled 2022-06-09: qty 4, 28d supply, fill #1

## 2022-04-15 ENCOUNTER — Other Ambulatory Visit: Payer: Self-pay

## 2022-05-03 ENCOUNTER — Other Ambulatory Visit: Payer: Self-pay | Admitting: Nurse Practitioner

## 2022-05-03 ENCOUNTER — Other Ambulatory Visit: Payer: Self-pay

## 2022-05-05 IMAGING — CR DG LUMBAR SPINE COMPLETE 4+V
5 series · 5 of 5 positions shown · non-contrast
Comparison: None.

CLINICAL DATA: Chronic back pain radiating to the right

EXAM:
LUMBAR SPINE - COMPLETE 4+ VIEW

[l-spine obl (1 of 2)]
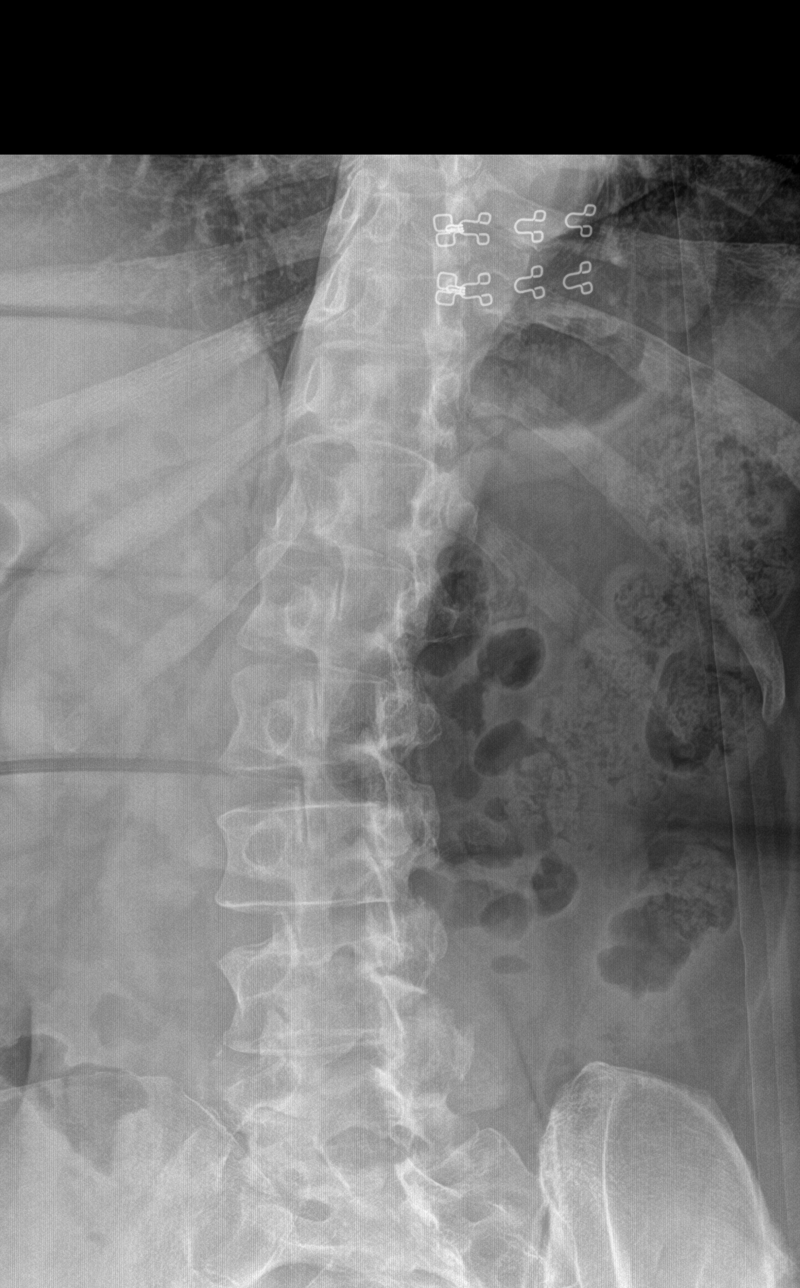

[l-spine obl (2 of 2)]
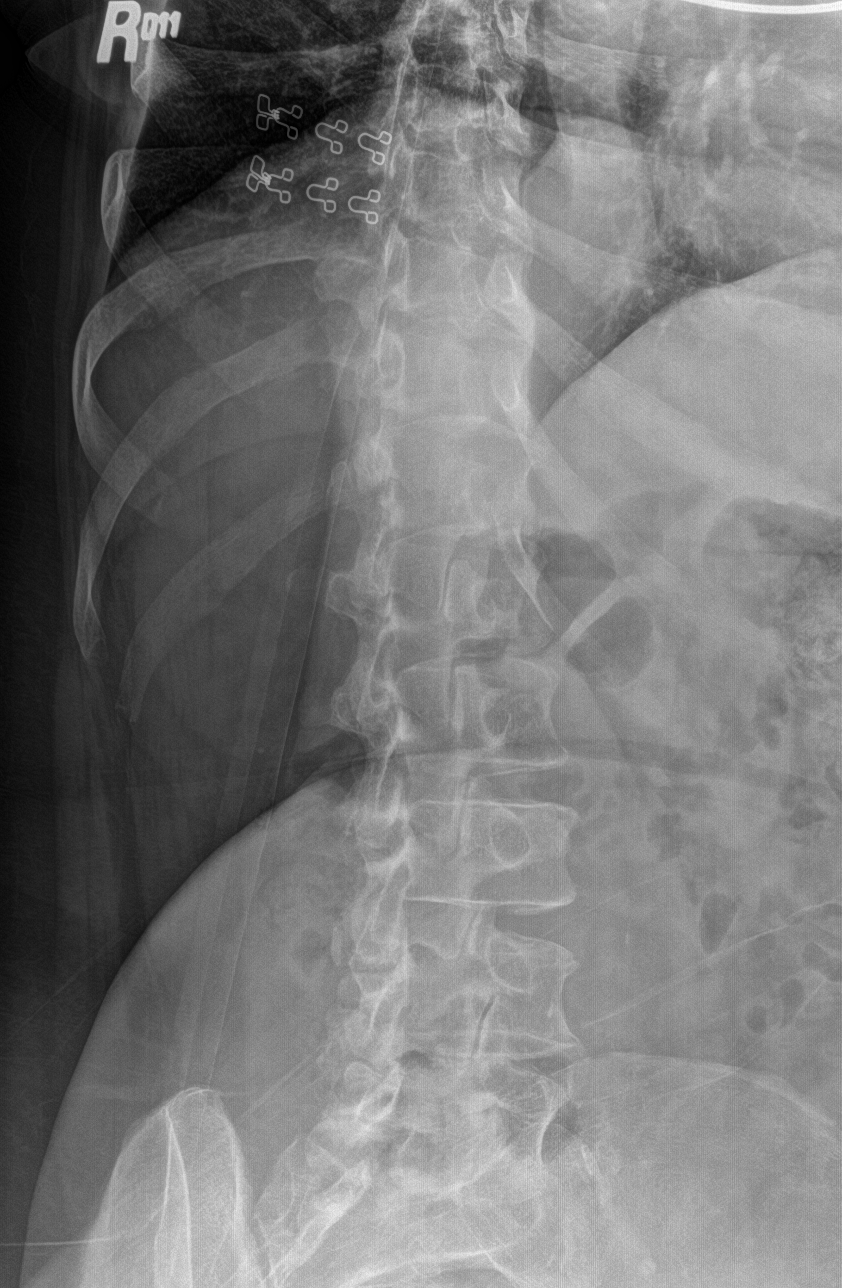

[l-spine lat]
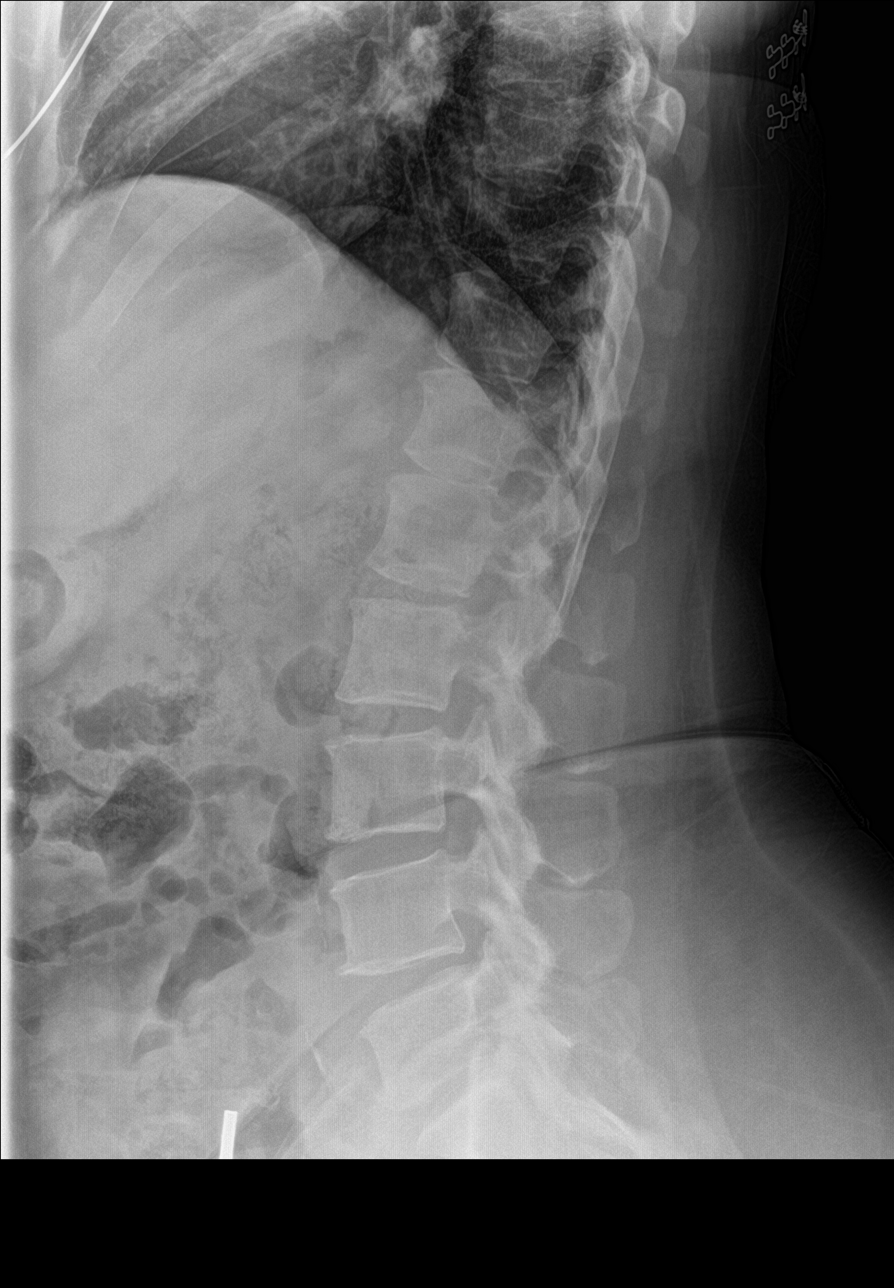

[l-spine spot]
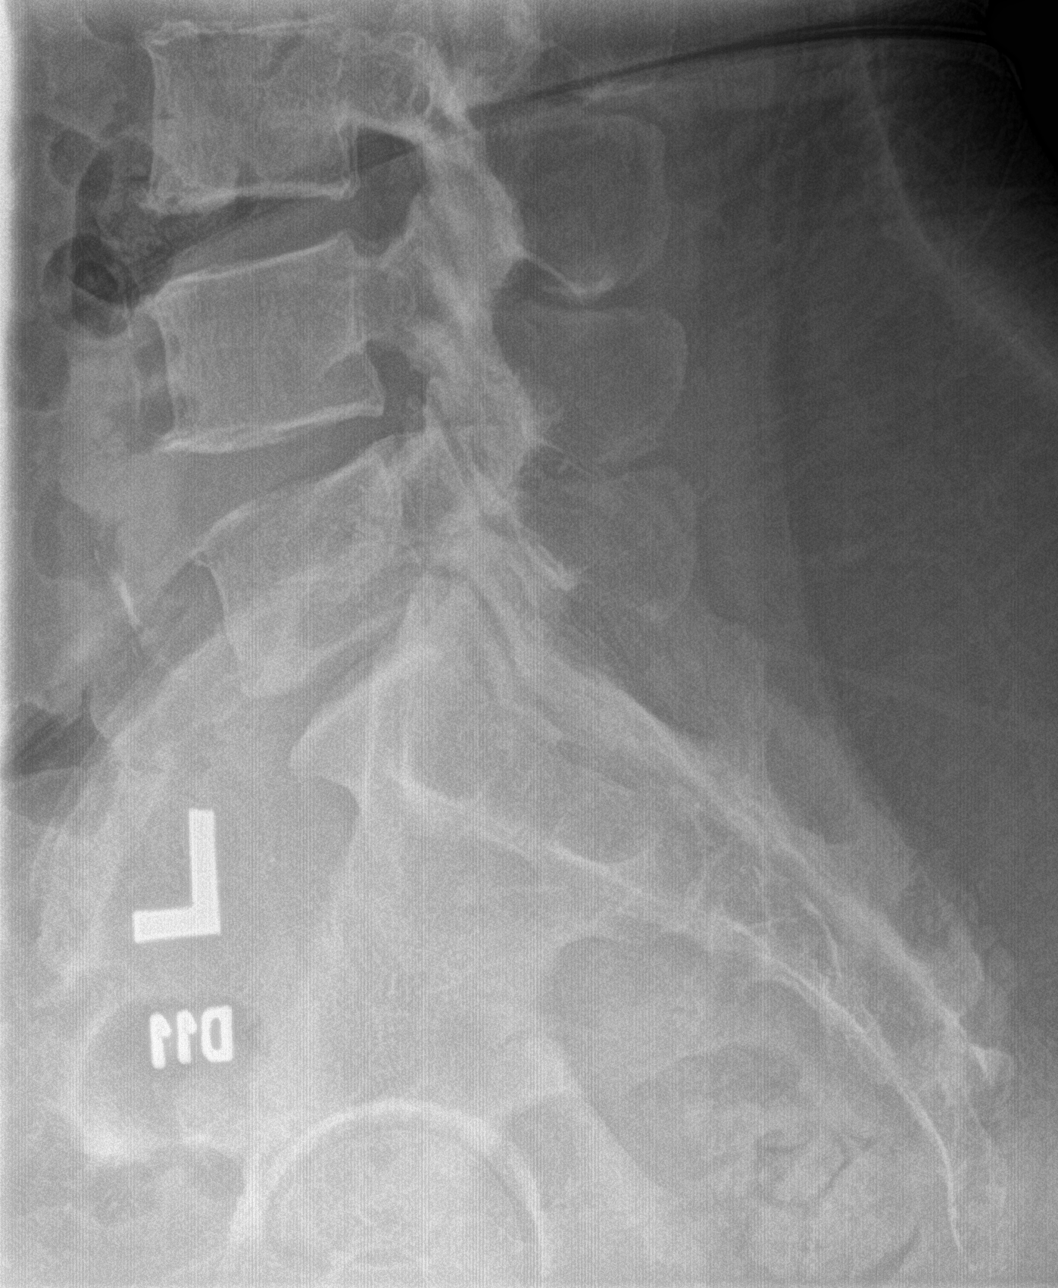

[l-spine ap]
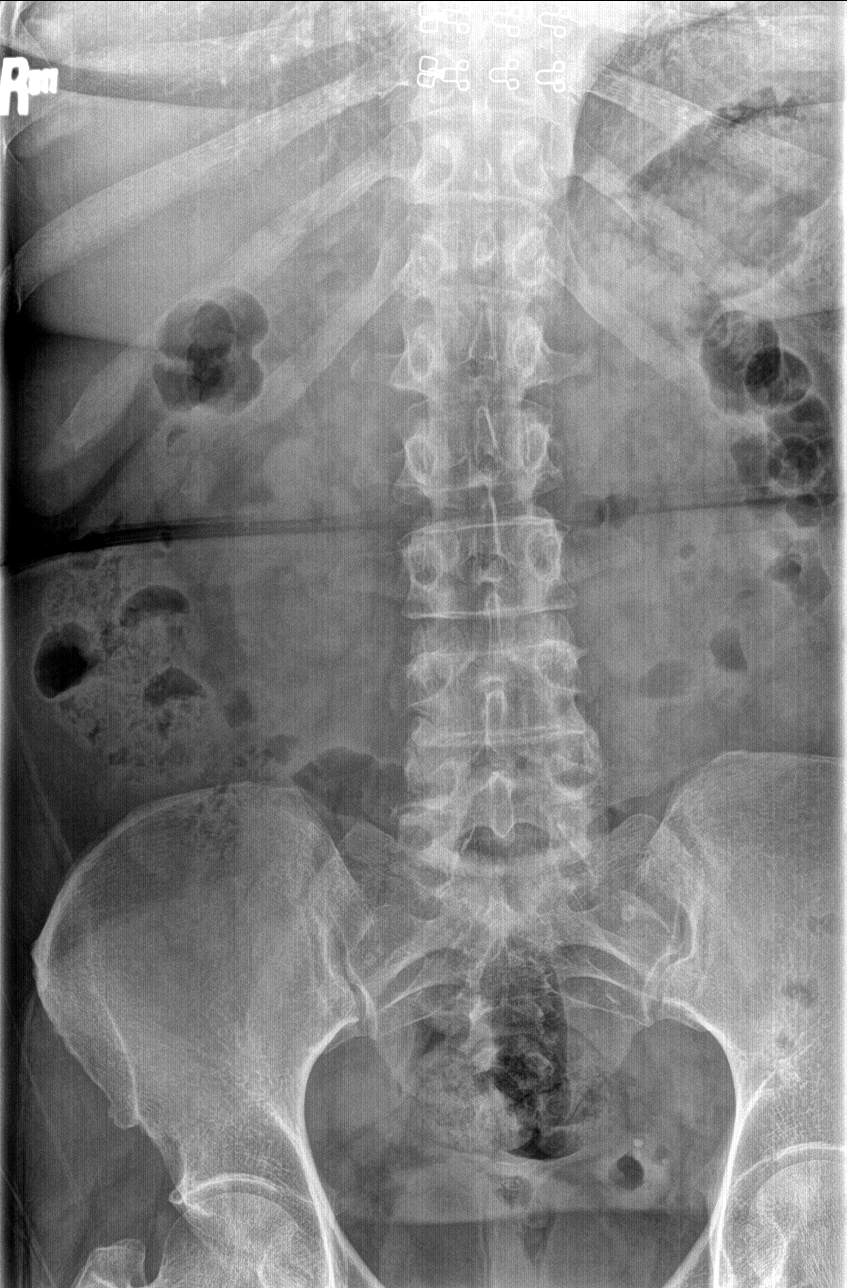

[5 of 5 positions shown; findings below may reference images not displayed]

FINDINGS: Five lumbar type vertebral bodies are well visualized. Vertebral
body height is well maintained. Mild osteophytic changes are seen.
No pars defects are noted. No anterolisthesis is noted. Mild disc
space narrowing at L4-5 is seen as well.
IMPRESSION: Mild degenerative changes without acute abnormality.

## 2022-05-06 ENCOUNTER — Other Ambulatory Visit: Payer: Self-pay

## 2022-05-13 ENCOUNTER — Other Ambulatory Visit: Payer: Self-pay

## 2022-06-03 ENCOUNTER — Other Ambulatory Visit: Payer: Self-pay

## 2022-06-09 ENCOUNTER — Other Ambulatory Visit: Payer: Self-pay | Admitting: Nurse Practitioner

## 2022-06-09 ENCOUNTER — Other Ambulatory Visit: Payer: Self-pay

## 2022-06-10 ENCOUNTER — Other Ambulatory Visit: Payer: Self-pay

## 2022-06-14 ENCOUNTER — Other Ambulatory Visit: Payer: Self-pay

## 2022-06-21 ENCOUNTER — Other Ambulatory Visit: Payer: Self-pay

## 2022-06-21 MED ORDER — MELOXICAM 7.5 MG PO TABS
7.5000 mg | ORAL_TABLET | Freq: Every day | ORAL | 3 refills | Status: AC
  Filled 2022-06-21 – 2022-09-21 (×4): qty 30, 30d supply, fill #0
  Filled 2022-10-29: qty 30, 30d supply, fill #1
  Filled 2023-05-01: qty 30, 30d supply, fill #2

## 2022-06-21 MED ORDER — METHOCARBAMOL 500 MG PO TABS
1000.0000 mg | ORAL_TABLET | Freq: Four times a day (QID) | ORAL | 6 refills | Status: AC
  Filled 2022-06-21: qty 112, 9d supply, fill #0
  Filled 2022-09-09: qty 112, 9d supply, fill #1
  Filled 2022-10-29: qty 112, 9d supply, fill #2
  Filled 2023-05-01: qty 112, 9d supply, fill #3

## 2022-06-21 MED ORDER — LOSARTAN POTASSIUM 100 MG PO TABS
100.0000 mg | ORAL_TABLET | Freq: Every day | ORAL | 6 refills | Status: DC
  Filled 2022-06-21: qty 30, 30d supply, fill #0
  Filled 2022-10-29 – 2023-05-01 (×2): qty 30, 30d supply, fill #1

## 2022-06-21 MED ORDER — GABAPENTIN 100 MG PO CAPS
100.0000 mg | ORAL_CAPSULE | Freq: Three times a day (TID) | ORAL | 0 refills | Status: AC
  Filled 2022-06-21: qty 15, 5d supply, fill #0

## 2022-06-21 MED ORDER — AMLODIPINE BESYLATE 10 MG PO TABS
10.0000 mg | ORAL_TABLET | Freq: Every day | ORAL | 6 refills | Status: DC
  Filled 2022-06-21: qty 30, 30d supply, fill #0
  Filled 2022-09-09: qty 30, 30d supply, fill #1
  Filled 2022-10-29: qty 30, 30d supply, fill #2
  Filled 2023-05-01: qty 30, 30d supply, fill #3

## 2022-06-21 MED ORDER — FLUTICASONE PROPIONATE 50 MCG/ACT NA SUSP
1.0000 | NASAL | 6 refills | Status: AC
  Filled 2022-06-21: qty 16, 30d supply, fill #0
  Filled 2022-10-29: qty 16, 60d supply, fill #0
  Filled 2023-05-01: qty 16, 60d supply, fill #1

## 2022-06-21 MED ORDER — DULOXETINE HCL 30 MG PO CPEP
30.0000 mg | ORAL_CAPSULE | Freq: Every day | ORAL | 6 refills | Status: DC
  Filled 2022-06-21: qty 30, 30d supply, fill #0
  Filled 2022-09-09: qty 30, 30d supply, fill #1
  Filled 2022-10-29: qty 30, 30d supply, fill #2
  Filled 2023-05-01: qty 30, 30d supply, fill #3

## 2022-06-21 MED ORDER — METHYLPREDNISOLONE 4 MG PO TBPK
ORAL_TABLET | ORAL | 0 refills | Status: DC
  Filled 2022-06-21: qty 21, 7d supply, fill #0

## 2022-06-23 ENCOUNTER — Other Ambulatory Visit: Payer: Self-pay

## 2022-09-09 ENCOUNTER — Other Ambulatory Visit: Payer: Self-pay | Admitting: Nurse Practitioner

## 2022-09-09 ENCOUNTER — Other Ambulatory Visit: Payer: Self-pay

## 2022-09-09 NOTE — Telephone Encounter (Signed)
Requested medication (s) are due for refill today: Yes  Requested medication (s) are on the active medication list: Yes  Last refill:  06/21/22  Future visit scheduled: No  Notes to clinic:  Left message to call and make appointment.    Requested Prescriptions  Pending Prescriptions Disp Refills   gabapentin (NEURONTIN) 100 MG capsule 15 capsule 0    Sig: Take 1 capsule (100 mg total) by mouth every 8 (eight) hours as needed for 5 days.     Neurology: Anticonvulsants - gabapentin Failed - 09/09/2022  1:12 PM      Failed - Cr in normal range and within 360 days    Creat  Date Value Ref Range Status  05/06/2014 0.80 0.50 - 1.10 mg/dL Final   Creatinine, Ser  Date Value Ref Range Status  03/06/2021 0.87 0.57 - 1.00 mg/dL Final         Failed - Completed PHQ-2 or PHQ-9 in the last 360 days      Failed - Valid encounter within last 12 months    Recent Outpatient Visits           1 year ago Essential hypertension   [place], Vernia Buff, NP   2 years ago Need for zoster vaccination   [place], [place], RPH-CPP   2 years ago Essential hypertension   [place], Vernia Buff, NP   2 years ago Need for zoster vaccination   [place], RPH-CPP   2 years ago Essential hypertension   [place], Vernia Buff, NP

## 2022-09-13 ENCOUNTER — Other Ambulatory Visit: Payer: Self-pay

## 2022-09-17 ENCOUNTER — Other Ambulatory Visit: Payer: Self-pay

## 2022-09-21 ENCOUNTER — Other Ambulatory Visit: Payer: Self-pay

## 2022-10-05 ENCOUNTER — Other Ambulatory Visit: Payer: Self-pay

## 2022-10-05 ENCOUNTER — Ambulatory Visit (INDEPENDENT_AMBULATORY_CARE_PROVIDER_SITE_OTHER): Payer: No Typology Code available for payment source

## 2022-10-05 ENCOUNTER — Encounter (HOSPITAL_COMMUNITY): Payer: Self-pay

## 2022-10-05 ENCOUNTER — Ambulatory Visit (HOSPITAL_COMMUNITY)
Admission: EM | Admit: 2022-10-05 | Discharge: 2022-10-05 | Disposition: A | Discharge status: Home / Self Care | Payer: No Typology Code available for payment source | Attending: Family Medicine | Admitting: Family Medicine

## 2022-10-05 DIAGNOSIS — Z1152 Encounter for screening for COVID-19: Secondary | ICD-10-CM | POA: Diagnosis not present

## 2022-10-05 DIAGNOSIS — J069 Acute upper respiratory infection, unspecified: Secondary | ICD-10-CM | POA: Diagnosis not present

## 2022-10-05 DIAGNOSIS — J4521 Mild intermittent asthma with (acute) exacerbation: Secondary | ICD-10-CM | POA: Insufficient documentation

## 2022-10-05 MED ORDER — ALBUTEROL SULFATE (2.5 MG/3ML) 0.083% IN NEBU
INHALATION_SOLUTION | RESPIRATORY_TRACT | Status: AC
  Filled 2022-10-05: qty 3

## 2022-10-05 MED ORDER — PREDNISONE 20 MG PO TABS
40.0000 mg | ORAL_TABLET | Freq: Every day | ORAL | 0 refills | Status: AC
  Filled 2022-10-05: qty 10, 5d supply, fill #0

## 2022-10-05 MED ORDER — ALBUTEROL SULFATE (2.5 MG/3ML) 0.083% IN NEBU
2.5000 mg | INHALATION_SOLUTION | Freq: Once | RESPIRATORY_TRACT | Status: AC
  Administered 2022-10-05: 2.5 mg via RESPIRATORY_TRACT

## 2022-10-05 MED ORDER — DOXYCYCLINE HYCLATE 100 MG PO TABS
100.0000 mg | ORAL_TABLET | Freq: Two times a day (BID) | ORAL | 0 refills | Status: AC
  Filled 2022-10-05: qty 14, 7d supply, fill #0

## 2022-10-05 MED ORDER — ALBUTEROL SULFATE HFA 108 (90 BASE) MCG/ACT IN AERS
2.0000 | INHALATION_SPRAY | RESPIRATORY_TRACT | 0 refills | Status: DC | PRN
  Filled 2022-10-05: qty 6.7, 25d supply, fill #0

## 2022-10-05 NOTE — ED Triage Notes (Signed)
Here for cough and congestion. Pt stated she feels SOB. Pt is taking OTC cough medication.

## 2022-10-05 NOTE — Discharge Instructions (Addendum)
Your chest x-ray was read as normal.  Albuterol inhaler--do 2 puffs every 4 hours as needed for shortness of breath or wheezing  Take prednisone 20 mg--2 daily for 5 days  Take doxycycline 100 mg --1 capsule 2 times daily for 7 days   You have been swabbed for COVID, and the test will result in the next 24 hours. Our staff will call you if positive. If the COVID test is positive, you should quarantine until you are fever free for 24 hours and you are starting to feel better, and then take added precautions for the next 5 days, such as physical distancing/wearing a mask and good hand hygiene/washing.

## 2022-10-05 NOTE — ED Provider Notes (Addendum)
[facility]    CSN: [id number] Arrival date & time: 10/05/22  1537      History   Chief Complaint Chief Complaint  Patient presents with   Shortness of Breath   Cough    HPI Leslie Steele is a 62 y.o. female.    Shortness of Breath Associated symptoms: cough   Cough Associated symptoms: shortness of breath    Here for cough and shortness of breath that began about 3 days ago.  She has not had much nasal congestion or drainage.  About 1 week ago she had started feeling achy in her muscles but then developed the tightness in her chest over the next couple of days.  She has never been known to have asthma.  Past Medical History:  Diagnosis Date   Depression    Hypertension     Patient Active Problem List   Diagnosis Date Noted   UTI (urinary tract infection) 06/11/2013   Dyslipidemia 06/11/2013   Preventative health care 06/11/2013   Essential hypertension 02/14/2013    Past Surgical History:  Procedure Laterality Date   ABDOMINAL HYSTERECTOMY     DG THUMB LEFT HAND     screws in hand    OB History   No obstetric history on file.      Home Medications    Prior to Admission medications   Medication Sig Start Date End Date Taking? Authorizing Provider  albuterol (VENTOLIN HFA) 108 (90 Base) MCG/ACT inhaler Inhale 2 puffs into the lungs every 4 (four) hours as needed for wheezing or shortness of breath. 10/05/22  Yes Zenia Resides, MD  doxycycline (VIBRAMYCIN) 100 MG capsule Take 1 capsule (100 mg total) by mouth 2 (two) times daily for 7 days. 10/05/22 10/12/22 Yes Boomer Winders, Janace Aris, MD  predniSONE (DELTASONE) 20 MG tablet Take 2 tablets (40 mg total) by mouth daily with breakfast for 5 days. 10/05/22 10/10/22 Yes Zenia Resides, MD  acetaminophen (TYLENOL) 500 MG tablet Take 1,000 mg by mouth every 4 (four) hours as needed for pain (headache).    [provider]  amLODipine (NORVASC) 10 MG tablet Take 1 tablet (10 mg total) by mouth  daily. 03/24/22     amLODipine (NORVASC) 10 MG tablet Take 1 tablet (10 mg total) by mouth daily. 06/19/22     atorvastatin (LIPITOR) 40 MG tablet Take 1 tablet (40 mg total) by mouth at bedtime. 04/14/22     Cholecalciferol (VITAMIN D3 ULTRA POTENCY) 1.25 MG (50000 UT) TABS Take 1 tablet by mouth once a week. 04/06/22     Cholecalciferol (D3-50) 1.25 MG (50000 UT) capsule Take 1 capsule (50,000 Units total) by mouth once a week for 90 days. 04/14/22     DULoxetine (CYMBALTA) 30 MG capsule Take 1 Capsule by mouth once daily 03/24/22     DULoxetine (CYMBALTA) 30 MG capsule Take 1 capsule (30 mg total) by mouth daily. 06/19/22     ELDERBERRY PO Take 2 capsules by mouth daily.    [provider]  famotidine (PEPCID) 20 MG tablet Take 1 tablet (20 mg total) by mouth every morning before breakfast. 04/14/22     ferrous sulfate 325 (65 FE) MG tablet TAKE 1 TABLET (325 MG TOTAL) BY MOUTH 2 (TWO) TIMES DAILY WITH A MEAL. 06/17/20 06/17/21  Claiborne Rigg, NP  fluconazole (DIFLUCAN) 150 MG tablet Take 1 tablet (150 mg total) by mouth daily. 02/26/22   Merrilee Jansky, MD  fluticasone (FLONASE) 50 MCG/ACT nasal spray  Place 2 Sprays in both nostrils once daily 03/24/22     fluticasone (FLONASE) 50 MCG/ACT nasal spray Place 1 spray into both nostrils as directed 06/19/22     gabapentin (NEURONTIN) 100 MG capsule Take 1 capsule (100 mg total) by mouth every 8 (eight) hours as needed for 5 days. 06/19/22     losartan (COZAAR) 100 MG tablet TAKE 1 TABLET (100 MG TOTAL) BY MOUTH DAILY. 03/06/21 03/06/22  Claiborne Rigg, NP  losartan (COZAAR) 100 MG tablet Take 1 Tablet by mouth once daily 03/24/22     losartan (COZAAR) 100 MG tablet Take 1 tablet (100 mg total) by mouth daily. 06/19/22     meloxicam (MOBIC) 7.5 MG tablet Take 1 Tablet by mouth once daily 03/24/22     meloxicam (MOBIC) 7.5 MG tablet Take 1 tablet (7.5 mg total) by mouth daily. 06/19/22     methocarbamol (ROBAXIN) 500 MG tablet Take 1 tablet by  mouth 3 (three) times daily as needed for muscle spasm. 03/24/22     methocarbamol (ROBAXIN) 500 MG tablet Take 2 tablets (1,000 mg total) by mouth 4 (four) times daily as needed for 14 days. 06/19/22     metroNIDAZOLE (FLAGYL) 500 MG tablet Take 1 tablet (500 mg total) by mouth 2 (two) times daily. 02/26/22   Merrilee Jansky, MD  OVER THE COUNTER MEDICATION Take 8 oz by mouth daily. Beet juice    [provider]  pravastatin (PRAVACHOL) 20 MG tablet Take 1 tablet (20 mg total) by mouth daily. 03/08/21   Claiborne Rigg, NP  Turmeric (QC TUMERIC COMPLEX PO) Take 2 capsules by mouth daily.    [provider]  lisinopril (PRINIVIL,ZESTRIL) 10 MG tablet TAKE 1 TABLET BY MOUTH EVERY DAY 04/22/15 11/09/19  Quentin Angst, MD    Family History Family History  Problem Relation Age of Onset   Hypertension Mother    Breast cancer Mother    Asthma Mother    Hypertension Father    Aneurysm Father    Prostate cancer Father    Asthma Son     Social History Social History   Tobacco Use   Smoking status: Never   Smokeless tobacco: Never  Substance Use Topics   Alcohol use: Yes    Comment: occ   Drug use: No     Allergies   Lisinopril   Review of Systems Review of Systems  Respiratory:  Positive for cough and shortness of breath.      Physical Exam Triage Vital Signs ED Triage Vitals [10/05/22 1542]  Enc Vitals Group     BP 121/88     Pulse Rate 93     Resp 18     Temp 98.8 F (37.1 C)     Temp Source Oral     SpO2 98 %     Weight      Height      Head Circumference      Peak Flow      Pain Score      Pain Loc      Pain Edu?      Excl. in GC?    No data found.  Updated Vital Signs BP 121/88 (BP Location: Left Arm)   Pulse 93   Temp 98.8 F (37.1 C) (Oral)   Resp 18   SpO2 98%   Visual Acuity Right Eye Distance:   Left Eye Distance:   Bilateral Distance:    Right Eye Near:   Left  Eye Near:    Bilateral Near:     Physical  Exam Vitals reviewed.  Constitutional:      General: She is not in acute distress.    Appearance: She is not ill-appearing, toxic-appearing or diaphoretic.  HENT:     Nose: Nose normal.     Mouth/Throat:     Mouth: Mucous membranes are moist.     Pharynx: No oropharyngeal exudate or posterior oropharyngeal erythema.  Eyes:     Extraocular Movements: Extraocular movements intact.     Conjunctiva/sclera: Conjunctivae normal.     Pupils: Pupils are equal, round, and reactive to light.  Cardiovascular:     Rate and Rhythm: Normal rate and regular rhythm.     Heart sounds: No murmur heard. Pulmonary:     Effort: No respiratory distress.     Breath sounds: No stridor. Wheezing present. No rhonchi or rales.     Comments: There are expiratory wheezes heard with good air movement.  Expiratory phase is a little bit prolonged. Musculoskeletal:     Cervical back: Neck supple.  Lymphadenopathy:     Cervical: No cervical adenopathy.  Skin:    Capillary Refill: Capillary refill takes less than 2 seconds.     Coloration: Skin is not jaundiced or pale.  Neurological:     General: No focal deficit present.     Mental Status: She is alert and oriented to person, place, and time.  Psychiatric:        Behavior: Behavior normal.      UC Treatments / Results  Labs (all labs ordered are listed, but only abnormal results are displayed) Labs Reviewed  SARS CORONAVIRUS 2 (TAT 6-24 HRS)    EKG   Radiology DG Chest 2 View  Result Date: 10/05/2022 CLINICAL DATA:  Shortness of breath for 3 days, cough, congestion EXAM: CHEST - 2 VIEW COMPARISON:  None Available. FINDINGS: Enlargement of cardiac silhouette. Mediastinal contours and pulmonary vascularity normal. Lungs clear. No pulmonary infiltrate, pleural effusion, or pneumothorax. Osseous structures unremarkable. IMPRESSION: Enlargement of cardiac silhouette. No acute infiltrates. Electronically Signed   By: Ulyses Southward M.D.   On: 10/05/2022  17:00    Procedures Procedures (including critical care time)  Medications Ordered in UC Medications  albuterol (PROVENTIL) (2.5 MG/3ML) 0.083% nebulizer solution 2.5 mg (2.5 mg Nebulization Given 10/05/22 1700)    Initial Impression / Assessment and Plan / UC Course  I have reviewed the triage vital signs and the nursing notes.  Pertinent labs & imaging results that were available during my care of the patient were reviewed by me and considered in my medical decision making (see chart for details).       After the nebulization, she is still wheezing but she is moving air better  Chest x-ray does not show any obvious infiltrate, but to me there is prominence of the vascular markings.  Radiology reading states that there is some enlargement of the cardiac silhouette.   The prominence of the lung markings I am going to go ahead and treat with doxycycline.  For what appears to be an asthma exacerbation, I am going to send in on his own and albuterol.  She is swabbed for COVID and if positive she is a candidate for Paxlovid.  I would count her start of her illness actually from 3 days ago when the cough started.  In October 23 her EGFR was 67; this is found in care everywhere Final Clinical Impressions(s) / UC Diagnoses   Final  diagnoses:  Viral URI with cough  Mild intermittent asthma with acute exacerbation     Discharge Instructions      Your chest x-ray was read as normal.  Albuterol inhaler--do 2 puffs every 4 hours as needed for shortness of breath or wheezing  Take prednisone 20 mg--2 daily for 5 days  Take doxycycline 100 mg --1 capsule 2 times daily for 7 days   You have been swabbed for COVID, and the test will result in the next 24 hours. Our staff will call you if positive. If the COVID test is positive, you should quarantine until you are fever free for 24 hours and you are starting to feel better, and then take added precautions for the next 5 days, such as  physical distancing/wearing a mask and good hand hygiene/washing.       ED Prescriptions     Medication Sig Dispense Auth. Provider   albuterol (VENTOLIN HFA) 108 (90 Base) MCG/ACT inhaler Inhale 2 puffs into the lungs every 4 (four) hours as needed for wheezing or shortness of breath. 1 each Zenia Resides, MD   predniSONE (DELTASONE) 20 MG tablet Take 2 tablets (40 mg total) by mouth daily with breakfast for 5 days. 10 tablet Zenia Resides, MD   doxycycline (VIBRAMYCIN) 100 MG capsule Take 1 capsule (100 mg total) by mouth 2 (two) times daily for 7 days. 14 capsule Marlinda Mike, Janace Aris, MD      PDMP not reviewed this encounter.   Zenia Resides, MD 10/05/22 1728    Zenia Resides, MD 10/05/22 [phone]

## 2022-10-06 LAB — SARS CORONAVIRUS 2 (TAT 6-24 HRS): SARS Coronavirus 2: NEGATIVE

## 2022-10-13 ENCOUNTER — Other Ambulatory Visit: Payer: Self-pay

## 2022-10-13 MED ORDER — BENZONATATE 100 MG PO CAPS
100.0000 mg | ORAL_CAPSULE | Freq: Three times a day (TID) | ORAL | 0 refills | Status: AC
  Filled 2022-10-13: qty 30, 10d supply, fill #0

## 2022-10-13 MED ORDER — ALBUTEROL SULFATE HFA 108 (90 BASE) MCG/ACT IN AERS
2.0000 | INHALATION_SPRAY | RESPIRATORY_TRACT | 1 refills | Status: AC | PRN
  Filled 2022-10-14: qty 6.7, 25d supply, fill #0
  Filled 2023-01-29: qty 6.7, 17d supply, fill #0
  Filled 2023-05-01: qty 6.7, 25d supply, fill #0

## 2022-10-14 ENCOUNTER — Other Ambulatory Visit: Payer: Self-pay

## 2022-10-30 ENCOUNTER — Other Ambulatory Visit: Payer: Self-pay

## 2022-11-03 ENCOUNTER — Other Ambulatory Visit: Payer: Self-pay

## 2022-12-14 ENCOUNTER — Other Ambulatory Visit: Payer: Self-pay

## 2023-01-31 ENCOUNTER — Other Ambulatory Visit: Payer: Self-pay

## 2023-02-09 ENCOUNTER — Other Ambulatory Visit: Payer: Self-pay

## 2023-05-01 ENCOUNTER — Other Ambulatory Visit: Payer: Self-pay

## 2023-05-01 ENCOUNTER — Emergency Department (HOSPITAL_COMMUNITY): Payer: No Typology Code available for payment source

## 2023-05-01 ENCOUNTER — Encounter (HOSPITAL_COMMUNITY): Payer: Self-pay

## 2023-05-01 ENCOUNTER — Emergency Department (HOSPITAL_COMMUNITY)
Admission: EM | Admit: 2023-05-01 | Discharge: 2023-05-01 | Disposition: A | Discharge status: Home / Self Care | Payer: No Typology Code available for payment source | Attending: Emergency Medicine | Admitting: Emergency Medicine

## 2023-05-01 DIAGNOSIS — R110 Nausea: Secondary | ICD-10-CM | POA: Diagnosis present

## 2023-05-01 DIAGNOSIS — Z79899 Other long term (current) drug therapy: Secondary | ICD-10-CM | POA: Insufficient documentation

## 2023-05-01 DIAGNOSIS — I10 Essential (primary) hypertension: Secondary | ICD-10-CM | POA: Insufficient documentation

## 2023-05-01 DIAGNOSIS — R059 Cough, unspecified: Secondary | ICD-10-CM | POA: Insufficient documentation

## 2023-05-01 LAB — COMPREHENSIVE METABOLIC PANEL
ALT: 17 U/L (ref 0–44)
AST: 18 U/L (ref 15–41)
Albumin: 3.7 g/dL (ref 3.5–5.0)
Alkaline Phosphatase: 72 U/L (ref 38–126)
Anion gap: 8 (ref 5–15)
BUN: 12 mg/dL (ref 8–23)
CO2: 23 mmol/L (ref 22–32)
Calcium: 9.9 mg/dL (ref 8.9–10.3)
Chloride: 106 mmol/L (ref 98–111)
Creatinine, Ser: 0.73 mg/dL (ref 0.44–1.00)
GFR, Estimated: >60 mL/min (ref >60)
Glucose, Bld: 102 mg/dL — ABNORMAL HIGH (ref 70–99)
Potassium: 4.1 mmol/L (ref 3.5–5.1)
Sodium: 137 mmol/L (ref 135–145)
Total Bilirubin: 1 mg/dL (ref <1.2)
Total Protein: 6.6 g/dL (ref 6.5–8.1)

## 2023-05-01 LAB — LIPASE, BLOOD: Lipase: 40 U/L (ref 11–51)

## 2023-05-01 LAB — CBC
HCT: 39.6 % (ref 36.0–46.0)
Hemoglobin: 12.6 g/dL (ref 12.0–15.0)
MCH: 23.7 pg — ABNORMAL LOW (ref 26.0–34.0)
MCHC: 31.8 g/dL (ref 30.0–36.0)
MCV: 74.4 fL — ABNORMAL LOW (ref 80.0–100.0)
Platelets: 245 10*3/uL (ref 150–400)
RBC: 5.32 MIL/uL — ABNORMAL HIGH (ref 3.87–5.11)
RDW: 15.4 % (ref 11.5–15.5)
WBC: 4.9 10*3/uL (ref 4.0–10.5)
nRBC: 0 % (ref 0.0–0.2)

## 2023-05-01 MED ORDER — ONDANSETRON 4 MG PO TBDP
4.0000 mg | ORAL_TABLET | Freq: Three times a day (TID) | ORAL | 0 refills | Status: AC | PRN
  Filled 2023-05-01: qty 10, 5d supply, fill #0

## 2023-05-01 MED ORDER — ONDANSETRON 4 MG PO TBDP
4.0000 mg | ORAL_TABLET | Freq: Once | ORAL | Status: AC
  Administered 2023-05-01: 4 mg via ORAL
  Filled 2023-05-01: qty 1

## 2023-05-01 NOTE — ED Provider Notes (Signed)
[facility] Provider Note   CSN: [id number] Arrival date & time: 05/01/23  1221     History {Add pertinent medical, surgical, social history, OB history to HPI:1} Chief Complaint  Patient presents with   Nausea   Cough    Leslie Steele is a 62 y.o. female.   Cough  Patient has history of hypertension UTI hyperlipidemia  Patient presents to the ED for evaluation of nausea and dry cough ongoing for the last month or so.  Patient states she has had more constant nausea symptoms.  She tried taking over-the-counter antacid medications and thought it helped initially but the symptoms returned when she stopped the course of medications.  Has not had any vomiting.  She does not have any abdominal pain.  Patient states she also has been coughing.  No fevers or shortness of breath.  Patient has not seen anyone for this prior to today  Home Medications Prior to Admission medications   Medication Sig Start Date End Date Taking? Authorizing Provider  acetaminophen (TYLENOL) 500 MG tablet Take 1,000 mg by mouth every 4 (four) hours as needed for pain (headache).    [provider]  albuterol (VENTOLIN HFA) 108 (90 Base) MCG/ACT inhaler Inhale 2 puffs into the lungs every 4 (four) hours as needed for shortness of breath. 10/13/22     amLODipine (NORVASC) 10 MG tablet Take 1 tablet (10 mg total) by mouth daily. 03/24/22     amLODipine (NORVASC) 10 MG tablet Take 1 tablet (10 mg total) by mouth daily. 06/19/22     atorvastatin (LIPITOR) 40 MG tablet Take 1 tablet (40 mg total) by mouth at bedtime. 04/14/22     benzonatate (TESSALON) 100 MG capsule Take 1 capsule (100 mg total) by mouth 3 (three) times daily. 10/13/22     Cholecalciferol (VITAMIN D3 ULTRA POTENCY) 1.25 MG (50000 UT) TABS Take 1 tablet by mouth once a week. 04/06/22     Cholecalciferol (D3-50) 1.25 MG (50000 UT) capsule Take 1 capsule (50,000 Units total) by mouth once a week for 90  days. 04/14/22     DULoxetine (CYMBALTA) 30 MG capsule Take 1 Capsule by mouth once daily 03/24/22     DULoxetine (CYMBALTA) 30 MG capsule Take 1 capsule (30 mg total) by mouth daily. 06/19/22     ELDERBERRY PO Take 2 capsules by mouth daily.    [provider]  famotidine (PEPCID) 20 MG tablet Take 1 tablet (20 mg total) by mouth every morning before breakfast. 04/14/22     ferrous sulfate 325 (65 FE) MG tablet TAKE 1 TABLET (325 MG TOTAL) BY MOUTH 2 (TWO) TIMES DAILY WITH A MEAL. 06/17/20 06/17/21  Claiborne Rigg, NP  fluconazole (DIFLUCAN) 150 MG tablet Take 1 tablet (150 mg total) by mouth daily. 02/26/22   Merrilee Jansky, MD  fluticasone (FLONASE) 50 MCG/ACT nasal spray Place 2 Sprays in both nostrils once daily 03/24/22     fluticasone (FLONASE) 50 MCG/ACT nasal spray Place 1 spray into both nostrils daily as directed. 06/19/22     gabapentin (NEURONTIN) 100 MG capsule Take 1 capsule (100 mg total) by mouth every 8 (eight) hours as needed for 5 days. 06/19/22     losartan (COZAAR) 100 MG tablet TAKE 1 TABLET (100 MG TOTAL) BY MOUTH DAILY. 03/06/21 03/06/22  Claiborne Rigg, NP  losartan (COZAAR) 100 MG tablet Take 1 Tablet by mouth once daily 03/24/22     losartan (COZAAR) 100  MG tablet Take 1 tablet (100 mg total) by mouth daily. 06/19/22     meloxicam (MOBIC) 7.5 MG tablet Take 1 Tablet by mouth once daily 03/24/22     meloxicam (MOBIC) 7.5 MG tablet Take 1 tablet (7.5 mg total) by mouth daily. 06/19/22     methocarbamol (ROBAXIN) 500 MG tablet Take 1 tablet by mouth 3 (three) times daily as needed for muscle spasm. 03/24/22     methocarbamol (ROBAXIN) 500 MG tablet Take 2 tablets (1,000 mg total) by mouth 4 (four) times daily as needed for 14 days. 06/19/22     metroNIDAZOLE (FLAGYL) 500 MG tablet Take 1 tablet (500 mg total) by mouth 2 (two) times daily. 02/26/22   Merrilee Jansky, MD  OVER THE COUNTER MEDICATION Take 8 oz by mouth daily. Beet juice    [provider]   pravastatin (PRAVACHOL) 20 MG tablet Take 1 tablet (20 mg total) by mouth daily. 03/08/21   Claiborne Rigg, NP  Turmeric (QC TUMERIC COMPLEX PO) Take 2 capsules by mouth daily.    [provider]  lisinopril (PRINIVIL,ZESTRIL) 10 MG tablet TAKE 1 TABLET BY MOUTH EVERY DAY 04/22/15 11/09/19  Quentin Angst, MD      Allergies    Lisinopril    Review of Systems   Review of Systems  Respiratory:  Positive for cough.     Physical Exam Updated Vital Signs BP (!) 126/98   Pulse 78   Temp 98.2 F (36.8 C) (Oral)   Resp 12   Ht 1.575 m (5\' 2" )   Wt 81.6 kg   SpO2 100%   BMI 32.92 kg/m  Physical Exam Vitals and nursing note reviewed.  Constitutional:      General: She is not in acute distress.    Appearance: She is well-developed.  HENT:     Head: Normocephalic and atraumatic.     Right Ear: External ear normal.     Left Ear: External ear normal.  Eyes:     General: No scleral icterus.       Right eye: No discharge.        Left eye: No discharge.     Conjunctiva/sclera: Conjunctivae normal.  Neck:     Trachea: No tracheal deviation.  Cardiovascular:     Rate and Rhythm: Normal rate and regular rhythm.  Pulmonary:     Effort: Pulmonary effort is normal. No respiratory distress.     Breath sounds: Normal breath sounds. No stridor. No wheezing or rales.  Abdominal:     General: Bowel sounds are normal. There is no distension.     Palpations: Abdomen is soft.     Tenderness: There is no abdominal tenderness. There is no guarding or rebound.  Musculoskeletal:        General: No tenderness or deformity.     Cervical back: Neck supple.  Skin:    General: Skin is warm and dry.     Findings: No rash.  Neurological:     General: No focal deficit present.     Mental Status: She is alert.     Cranial Nerves: No cranial nerve deficit, dysarthria or facial asymmetry.     Sensory: No sensory deficit.     Motor: No abnormal muscle tone or seizure activity.      Coordination: Coordination normal.  Psychiatric:        Mood and Affect: Mood normal.     ED Results / Procedures / Treatments   Labs (all  labs ordered are listed, but only abnormal results are displayed) Labs Reviewed - No data to display  EKG None  Radiology No results found.  Procedures Procedures  {Document cardiac monitor, telemetry assessment procedure when appropriate:1}  Medications Ordered in ED Medications - No data to display  ED Course/ Medical Decision Making/ A&P   {   Click here for ABCD2, HEART and other calculatorsREFRESH Note before signing :1}                              Medical Decision Making Amount and/or Complexity of Data Reviewed Labs: ordered. Radiology: ordered.   ***  {Document critical care time when appropriate:1} {Document review of labs and clinical decision tools ie heart score, Chads2Vasc2 etc:1}  {Document your independent review of radiology images, and any outside records:1} {Document your discussion with family members, caretakers, and with consultants:1} {Document social determinants of health affecting pt's care:1} {Document your decision making why or why not admission, treatments were needed:1} Final Clinical Impression(s) / ED Diagnoses Final diagnoses:  None    Rx / DC Orders ED Discharge Orders     None

## 2023-05-01 NOTE — ED Triage Notes (Addendum)
Pt c.o constant nausea for the past month. Denies any actual vomiting, denies abd pain. Pt also c.o dry cough x 1 month

## 2023-05-01 NOTE — Discharge Instructions (Signed)
Your lab work and x-ray today were reassuring.  You can take the prescribed nausea medication as needed.  You should follow-up closely with your doctor within the next week.  You can continue taking your Nexium.  If you develop abdominal pain, persistent nausea and vomiting or any other new concerning symptoms you should return to the ED.

## 2023-05-02 ENCOUNTER — Other Ambulatory Visit: Payer: Self-pay

## 2023-05-02 MED ORDER — FLUTICASONE PROPIONATE 50 MCG/ACT NA SUSP
2.0000 | Freq: Every day | NASAL | 3 refills | Status: AC
  Filled 2023-05-02: qty 16, 30d supply, fill #0

## 2023-06-23 ENCOUNTER — Other Ambulatory Visit: Payer: Self-pay

## 2023-06-24 ENCOUNTER — Other Ambulatory Visit: Payer: Self-pay

## 2023-06-28 ENCOUNTER — Other Ambulatory Visit: Payer: Self-pay

## 2023-06-28 ENCOUNTER — Other Ambulatory Visit (HOSPITAL_COMMUNITY): Payer: Self-pay

## 2023-06-28 MED ORDER — AMLODIPINE BESYLATE 10 MG PO TABS
10.0000 mg | ORAL_TABLET | Freq: Every day | ORAL | 0 refills | Status: DC
  Filled 2023-06-28: qty 30, 30d supply, fill #0

## 2023-06-28 MED ORDER — DULOXETINE HCL 30 MG PO CPEP
30.0000 mg | ORAL_CAPSULE | Freq: Every day | ORAL | 0 refills | Status: DC
  Filled 2023-06-28: qty 30, 30d supply, fill #0

## 2023-06-28 MED ORDER — LOSARTAN POTASSIUM 100 MG PO TABS
100.0000 mg | ORAL_TABLET | Freq: Every day | ORAL | 0 refills | Status: DC
  Filled 2023-06-28: qty 30, 30d supply, fill #0

## 2023-06-29 ENCOUNTER — Other Ambulatory Visit: Payer: Self-pay

## 2023-07-13 ENCOUNTER — Other Ambulatory Visit: Payer: Self-pay

## 2023-07-13 MED ORDER — NAPROXEN 500 MG PO TABS
500.0000 mg | ORAL_TABLET | Freq: Two times a day (BID) | ORAL | 3 refills | Status: AC
  Filled 2023-07-13: qty 60, 30d supply, fill #0

## 2023-07-13 MED ORDER — CYCLOBENZAPRINE HCL 5 MG PO TABS
5.0000 mg | ORAL_TABLET | Freq: Three times a day (TID) | ORAL | 0 refills | Status: AC | PRN
  Filled 2023-07-13: qty 30, 10d supply, fill #0

## 2023-08-02 ENCOUNTER — Other Ambulatory Visit: Payer: Self-pay

## 2023-08-02 MED ORDER — DULOXETINE HCL 30 MG PO CPEP
30.0000 mg | ORAL_CAPSULE | Freq: Every day | ORAL | 0 refills | Status: DC
  Filled 2023-08-02 (×3): qty 30, 30d supply, fill #0

## 2023-08-02 MED ORDER — LOSARTAN POTASSIUM 100 MG PO TABS
100.0000 mg | ORAL_TABLET | Freq: Every day | ORAL | 0 refills | Status: DC
  Filled 2023-08-02 (×3): qty 30, 30d supply, fill #0

## 2023-08-02 MED ORDER — AMLODIPINE BESYLATE 10 MG PO TABS
10.0000 mg | ORAL_TABLET | Freq: Every day | ORAL | 0 refills | Status: DC
  Filled 2023-08-02 (×3): qty 30, 30d supply, fill #0

## 2023-08-23 ENCOUNTER — Other Ambulatory Visit: Payer: Self-pay

## 2023-08-23 MED ORDER — LOSARTAN POTASSIUM 100 MG PO TABS
100.0000 mg | ORAL_TABLET | Freq: Every day | ORAL | 1 refills | Status: AC
  Filled 2023-09-21 – 2023-09-22 (×2): qty 90, 90d supply, fill #0
  Filled 2023-11-18 (×2): qty 90, 90d supply, fill #1

## 2023-08-23 MED ORDER — AMLODIPINE BESYLATE 10 MG PO TABS
10.0000 mg | ORAL_TABLET | Freq: Every day | ORAL | 1 refills | Status: AC
  Filled 2023-09-21 – 2023-09-22 (×2): qty 90, 90d supply, fill #0
  Filled 2023-11-18 (×2): qty 90, 90d supply, fill #1

## 2023-08-23 MED ORDER — MELOXICAM 15 MG PO TABS
15.0000 mg | ORAL_TABLET | Freq: Every day | ORAL | 1 refills | Status: DC
  Filled 2023-08-23: qty 90, 90d supply, fill #0
  Filled 2023-09-21 – 2023-11-18 (×3): qty 90, 90d supply, fill #1

## 2023-08-23 MED ORDER — DULOXETINE HCL 60 MG PO CPEP
60.0000 mg | ORAL_CAPSULE | Freq: Every day | ORAL | 2 refills | Status: AC
  Filled 2023-08-23: qty 90, 90d supply, fill #0
  Filled 2023-09-21 – 2023-11-18 (×3): qty 90, 90d supply, fill #1

## 2023-08-23 MED ORDER — METHYLPREDNISOLONE 4 MG PO TBPK
ORAL_TABLET | ORAL | 0 refills | Status: AC
  Filled 2023-08-23: qty 21, 6d supply, fill #0

## 2023-09-21 ENCOUNTER — Other Ambulatory Visit: Payer: Self-pay

## 2023-09-22 ENCOUNTER — Other Ambulatory Visit: Payer: Self-pay

## 2023-09-23 ENCOUNTER — Other Ambulatory Visit: Payer: Self-pay

## 2023-11-18 ENCOUNTER — Other Ambulatory Visit: Payer: Self-pay

## 2023-11-21 ENCOUNTER — Other Ambulatory Visit: Payer: Self-pay

## 2023-11-29 ENCOUNTER — Other Ambulatory Visit: Payer: Self-pay

## 2023-11-29 MED ORDER — AMLODIPINE BESYLATE 10 MG PO TABS
10.0000 mg | ORAL_TABLET | Freq: Every day | ORAL | 1 refills | Status: AC
  Filled 2023-11-29 – 2024-01-02 (×3): qty 90, 90d supply, fill #0
  Filled 2024-04-16: qty 90, 90d supply, fill #1

## 2023-11-29 MED ORDER — LOSARTAN POTASSIUM 100 MG PO TABS
100.0000 mg | ORAL_TABLET | Freq: Every day | ORAL | 1 refills | Status: AC
  Filled 2023-11-29 – 2024-01-02 (×2): qty 90, 90d supply, fill #0
  Filled 2024-04-16: qty 90, 90d supply, fill #1

## 2023-11-29 MED ORDER — NAPROXEN 500 MG PO TABS
500.0000 mg | ORAL_TABLET | Freq: Two times a day (BID) | ORAL | 3 refills | Status: AC
  Filled 2023-11-29: qty 180, 90d supply, fill #0
  Filled 2024-04-16: qty 180, 90d supply, fill #1

## 2023-11-29 MED ORDER — DULOXETINE HCL 60 MG PO CPEP
60.0000 mg | ORAL_CAPSULE | Freq: Every day | ORAL | 1 refills | Status: AC
  Filled 2023-11-29 – 2024-04-16 (×2): qty 90, 90d supply, fill #0

## 2023-11-29 MED ORDER — GABAPENTIN 100 MG PO CAPS
100.0000 mg | ORAL_CAPSULE | ORAL | 3 refills | Status: AC
  Filled 2023-11-29: qty 90, 30d supply, fill #0
  Filled 2024-04-16: qty 90, 30d supply, fill #1

## 2023-12-01 ENCOUNTER — Other Ambulatory Visit: Payer: Self-pay

## 2023-12-01 MED ORDER — ROSUVASTATIN CALCIUM 20 MG PO TABS
20.0000 mg | ORAL_TABLET | Freq: Every day | ORAL | 3 refills | Status: AC
  Filled 2023-12-01: qty 90, 90d supply, fill #0
  Filled 2024-04-16: qty 90, 90d supply, fill #1

## 2023-12-06 ENCOUNTER — Other Ambulatory Visit: Payer: Self-pay

## 2024-01-02 ENCOUNTER — Other Ambulatory Visit: Payer: Self-pay

## 2024-01-03 ENCOUNTER — Other Ambulatory Visit: Payer: Self-pay

## 2024-04-16 ENCOUNTER — Other Ambulatory Visit: Payer: Self-pay

## 2024-04-16 MED ORDER — MELOXICAM 15 MG PO TABS
15.0000 mg | ORAL_TABLET | Freq: Every day | ORAL | 1 refills | Status: AC
  Filled 2024-04-16: qty 90, 90d supply, fill #0

## 2024-04-19 ENCOUNTER — Other Ambulatory Visit: Payer: Self-pay

## 2024-04-23 ENCOUNTER — Other Ambulatory Visit: Payer: Self-pay
